# Patient Record
Sex: Female | Born: 1960
Health system: Southern US, Community
[De-identification: ages and names within clinical notes are randomized; demographics above are authoritative.]

## PROBLEM LIST (undated history)

## (undated) DIAGNOSIS — R011 Cardiac murmur, unspecified: Secondary | ICD-10-CM

## (undated) DIAGNOSIS — D649 Anemia, unspecified: Secondary | ICD-10-CM

## (undated) DIAGNOSIS — T8859XA Other complications of anesthesia, initial encounter: Secondary | ICD-10-CM

## (undated) DIAGNOSIS — T4145XA Adverse effect of unspecified anesthetic, initial encounter: Secondary | ICD-10-CM

## (undated) DIAGNOSIS — T7840XA Allergy, unspecified, initial encounter: Secondary | ICD-10-CM

## (undated) DIAGNOSIS — M1990 Unspecified osteoarthritis, unspecified site: Secondary | ICD-10-CM

## (undated) HISTORY — DX: Other specified conditions originating in the perinatal period: R01.1

## (undated) HISTORY — PX: OTHER SURGICAL HISTORY: SHX169

## (undated) HISTORY — DX: Allergy, unspecified, initial encounter: T78.40XA

## (undated) HISTORY — PX: COLONOSCOPY: SHX174

## (undated) HISTORY — DX: Anemia, unspecified: D64.9

---

## 2005-05-15 ENCOUNTER — Ambulatory Visit: Payer: Self-pay | Admitting: Podiatry

## 2005-08-21 ENCOUNTER — Ambulatory Visit: Payer: Self-pay | Admitting: Unknown Physician Specialty

## 2006-03-13 HISTORY — PX: REFRACTIVE SURGERY: SHX103

## 2006-07-04 ENCOUNTER — Ambulatory Visit: Payer: Self-pay

## 2009-10-11 ENCOUNTER — Ambulatory Visit: Payer: Self-pay

## 2010-03-31 ENCOUNTER — Ambulatory Visit: Payer: Self-pay | Admitting: Podiatry

## 2010-04-13 ENCOUNTER — Ambulatory Visit: Payer: Self-pay | Admitting: Podiatry

## 2010-10-17 ENCOUNTER — Other Ambulatory Visit: Payer: Self-pay | Admitting: Emergency Medicine

## 2010-10-19 ENCOUNTER — Ambulatory Visit: Payer: Self-pay | Admitting: Emergency Medicine

## 2013-03-31 ENCOUNTER — Ambulatory Visit: Payer: Self-pay

## 2013-03-31 LAB — COMPREHENSIVE METABOLIC PANEL
ALK PHOS: 73 U/L
ALT: 26 U/L (ref 12–78)
AST: 14 U/L — AB (ref 15–37)
Albumin: 3.7 g/dL (ref 3.4–5.0)
Anion Gap: 10 (ref 7–16)
BUN: 15 mg/dL (ref 7–18)
Bilirubin,Total: 0.4 mg/dL (ref 0.2–1.0)
CALCIUM: 9.5 mg/dL (ref 8.5–10.1)
CO2: 28 mmol/L (ref 21–32)
CREATININE: 0.76 mg/dL (ref 0.60–1.30)
Chloride: 103 mmol/L (ref 98–107)
EGFR (African American): >60
Glucose: 104 mg/dL — ABNORMAL HIGH (ref 65–99)
Osmolality: 282 (ref 275–301)
Potassium: 4.4 mmol/L (ref 3.5–5.1)
Sodium: 141 mmol/L (ref 136–145)
TOTAL PROTEIN: 7.4 g/dL (ref 6.4–8.2)

## 2013-03-31 LAB — CBC WITH DIFFERENTIAL/PLATELET
BASOS ABS: 0.1 10*3/uL (ref 0.0–0.1)
BASOS PCT: 1.2 %
EOS ABS: 0.3 10*3/uL (ref 0.0–0.7)
Eosinophil %: 3.2 %
HCT: 38 % (ref 35.0–47.0)
HGB: 12.8 g/dL (ref 12.0–16.0)
LYMPHS ABS: 1.7 10*3/uL (ref 1.0–3.6)
LYMPHS PCT: 20.4 %
MCH: 30.4 pg (ref 26.0–34.0)
MCHC: 33.8 g/dL (ref 32.0–36.0)
MCV: 90 fL (ref 80–100)
MONOS PCT: 8.4 %
Monocyte #: 0.7 x10 3/mm (ref 0.2–0.9)
NEUTROS ABS: 5.7 10*3/uL (ref 1.4–6.5)
NEUTROS PCT: 66.8 %
Platelet: 294 10*3/uL (ref 150–440)
RBC: 4.22 10*6/uL (ref 3.80–5.20)
RDW: 12.5 % (ref 11.5–14.5)
WBC: 8.5 10*3/uL (ref 3.6–11.0)

## 2013-04-07 ENCOUNTER — Encounter: Payer: Self-pay | Admitting: Internal Medicine

## 2013-05-20 ENCOUNTER — Ambulatory Visit (AMBULATORY_SURGERY_CENTER): Payer: 59

## 2013-05-20 VITALS — Ht 65.0 in | Wt 176.6 lb

## 2013-05-20 DIAGNOSIS — Z1211 Encounter for screening for malignant neoplasm of colon: Secondary | ICD-10-CM

## 2013-05-20 MED ORDER — MOVIPREP 100 G PO SOLR: ORAL | Status: DC

## 2013-06-03 ENCOUNTER — Encounter: Payer: Self-pay | Admitting: Internal Medicine

## 2013-06-03 ENCOUNTER — Ambulatory Visit (AMBULATORY_SURGERY_CENTER): Payer: 59 | Admitting: Internal Medicine

## 2013-06-03 VITALS — BP 137/94 | HR 48 | Temp 98.0°F | Resp 24 | Ht 65.0 in | Wt 176.0 lb

## 2013-06-03 DIAGNOSIS — D126 Benign neoplasm of colon, unspecified: Secondary | ICD-10-CM

## 2013-06-03 DIAGNOSIS — Z1211 Encounter for screening for malignant neoplasm of colon: Secondary | ICD-10-CM

## 2013-06-03 MED ORDER — SODIUM CHLORIDE 0.9 % IV SOLN: 500.0000 mL | INTRAVENOUS | Status: DC

## 2013-06-03 NOTE — Progress Notes (Signed)
Called to room to assist during endoscopic procedure.  Patient ID and intended procedure confirmed with present staff. Received instructions for my participation in the procedure from the performing physician.  

## 2013-06-03 NOTE — Op Note (Signed)
[facility]  [address] [place], [id number]   COLONOSCOPY PROCEDURE REPORT  PATIENT: Sara Figueroa, Sara Figueroa  MR#: [id number] BIRTHDATE: 1960-05-28 , 63  yrs. old GENDER: Female ENDOSCOPIST: Jerene Bears, MD REFERRED BY: Maurine Minister PROCEDURE DATE:  06/03/2013 PROCEDURE:   Colonoscopy with snare polypectomy and Colonoscopy with cold biopsy polypectomy First Screening Colonoscopy - Avg.  risk and is 50 yrs.  old or older Yes.  Prior Negative Screening - Now for repeat screening. N/A  History of Adenoma - Now for follow-up colonoscopy & has been > or = to 3 yrs.  N/A  Polyps Removed Today? Yes. ASA CLASS:   Class II INDICATIONS:average risk screening and first colonoscopy. MEDICATIONS: MAC sedation, administered by CRNA, Propofol (Diprivan), and Propofol (Diprivan) 330 mg IV  DESCRIPTION OF PROCEDURE:   After the risks benefits and alternatives of the procedure were thoroughly explained, informed consent was obtained.  A digital rectal exam revealed no rectal mass.   The LB [id number]  endoscope was introduced through the anus and advanced to the terminal ileum which was intubated for a short distance. No adverse events experienced.   The quality of the prep was good, using MoviPrep  The instrument was then slowly withdrawn as the colon was fully examined.   COLON FINDINGS: The mucosa appeared normal in the terminal ileum. Two sessile polyps measuring 3-4 mm in size were found at the hepatic flexure and in the descending colon.  Polypectomy was performed with cold forceps and using cold snare.  All resections were complete and all polyp tissue was completely retrieved.   The colon mucosa was otherwise normal.  Retroflexed views revealed no abnormalities. The time to cecum=3 minutes 33 seconds.  Withdrawal time=13 minutes 40 seconds.  The scope was withdrawn and the procedure completed.  COMPLICATIONS: There were no complications.     ENDOSCOPIC  IMPRESSION: 1.   Normal mucosa in the terminal ileum 2.   Two sessile polyps measuring 3-4 mm in size were found at the hepatic flexure and in the descending colon; Polypectomy was performed with cold forceps and using cold snare 3.   The colon mucosa was otherwise normal  RECOMMENDATIONS: 1.  Await pathology results 2.  If the polyps removed today are proven to be adenomatous (pre-cancerous) polyps, you will need a repeat colonoscopy in 5 years.  Otherwise you should continue to follow colorectal cancer screening guidelines for "routine risk" patients with colonoscopy in 10 years.  You will receive a letter within 1-2 weeks with the results of your biopsy as well as final recommendations.  Please call my office if you have not received a letter after 3 weeks.   eSigned:  Jerene Bears, MD 06/03/2013 10:47 AM   cc: The Patient; Maurine Minister   PATIENT NAME:  Milton, Streicher MR#: [id number]

## 2013-06-03 NOTE — Progress Notes (Signed)
Report to pacu rn, vss, bbs=clear 

## 2013-06-03 NOTE — Patient Instructions (Signed)
YOU HAD AN ENDOSCOPIC PROCEDURE TODAY AT THE [facility]: Refer to the procedure report that was given to you for any specific questions about what was found during the examination.  If the procedure report does not answer your questions, please call your gastroenterologist to clarify.  If you requested that your care partner not be given the details of your procedure findings, then the procedure report has been included in a sealed envelope for you to review at your convenience later.  YOU SHOULD EXPECT: Some feelings of bloating in the abdomen. Passage of more gas than usual.  Walking can help get rid of the air that was put into your GI tract during the procedure and reduce the bloating. If you had a lower endoscopy (such as a colonoscopy or flexible sigmoidoscopy) you may notice spotting of blood in your stool or on the toilet paper. If you underwent a bowel prep for your procedure, then you may not have a normal bowel movement for a few days.  DIET: Your first meal following the procedure should be a light meal and then it is ok to progress to your normal diet.  A half-sandwich or bowl of soup is an example of a good first meal.  Heavy or fried foods are harder to digest and may make you feel nauseous or bloated.  Likewise meals heavy in dairy and vegetables can cause extra gas to form and this can also increase the bloating.  Drink plenty of fluids but you should avoid alcoholic beverages for 24 hours.  ACTIVITY: Your care partner should take you home directly after the procedure.  You should plan to take it easy, moving slowly for the rest of the day.  You can resume normal activity the day after the procedure however you should NOT DRIVE or use heavy machinery for 24 hours (because of the sedation medicines used during the test).    SYMPTOMS TO REPORT IMMEDIATELY: A gastroenterologist can be reached at any hour.  During normal business hours, 8:30 AM to 5:00 PM Monday through Friday,  call [phone].  After hours and on weekends, please call the GI answering service at [phone] who will take a message and have the physician on call contact you.   Following lower endoscopy (colonoscopy or flexible sigmoidoscopy):  Excessive amounts of blood in the stool  Significant tenderness or worsening of abdominal pains  Swelling of the abdomen that is new, acute  Fever of 100F or higher  FOLLOW UP: If any biopsies were taken you will be contacted by phone or by letter within the next 1-3 weeks.  Call your gastroenterologist if you have not heard about the biopsies in 3 weeks.  Our staff will call the home number listed on your records the next business day following your procedure to check on you and address any questions or concerns that you may have at that time regarding the information given to you following your procedure. This is a courtesy call and so if there is no answer at the home number and we have not heard from you through the emergency physician on call, we will assume that you have returned to your regular daily activities without incident.  SIGNATURES/CONFIDENTIALITY: You and/or your care partner have signed paperwork which will be entered into your electronic medical record.  These signatures attest to the fact that that the information above on your After Visit Summary has been reviewed and is understood.  Full responsibility of the confidentiality of this   discharge information lies with you and/or your care-partner.  Recommendations Await pathology results Next colonoscopy will be determined by pathology report

## 2013-06-04 ENCOUNTER — Telehealth: Payer: Self-pay | Admitting: *Deleted

## 2013-06-04 NOTE — Telephone Encounter (Signed)
  Follow up Call-  Call back number 06/03/2013  Post procedure Call Back phone  # [phone]  Permission to leave phone message Yes     No answer,left message.

## 2013-06-09 ENCOUNTER — Encounter: Payer: Self-pay | Admitting: Internal Medicine

## 2013-06-10 ENCOUNTER — Encounter: Payer: Self-pay | Admitting: *Deleted

## 2013-07-03 ENCOUNTER — Ambulatory Visit: Payer: Self-pay | Admitting: Family Medicine

## 2013-08-05 ENCOUNTER — Ambulatory Visit: Payer: Self-pay | Admitting: Family Medicine

## 2014-06-23 ENCOUNTER — Encounter: Payer: Self-pay | Admitting: *Deleted

## 2014-09-11 DIAGNOSIS — G4700 Insomnia, unspecified: Secondary | ICD-10-CM | POA: Insufficient documentation

## 2014-09-11 DIAGNOSIS — B001 Herpesviral vesicular dermatitis: Secondary | ICD-10-CM | POA: Insufficient documentation

## 2014-09-11 DIAGNOSIS — Z780 Asymptomatic menopausal state: Secondary | ICD-10-CM | POA: Insufficient documentation

## 2014-09-11 DIAGNOSIS — D649 Anemia, unspecified: Secondary | ICD-10-CM | POA: Insufficient documentation

## 2014-09-11 DIAGNOSIS — R001 Bradycardia, unspecified: Secondary | ICD-10-CM | POA: Insufficient documentation

## 2014-09-11 DIAGNOSIS — IMO0002 Reserved for concepts with insufficient information to code with codable children: Secondary | ICD-10-CM | POA: Insufficient documentation

## 2014-09-11 DIAGNOSIS — S32009A Unspecified fracture of unspecified lumbar vertebra, initial encounter for closed fracture: Secondary | ICD-10-CM | POA: Insufficient documentation

## 2014-09-11 DIAGNOSIS — Z87442 Personal history of urinary calculi: Secondary | ICD-10-CM | POA: Insufficient documentation

## 2014-10-01 ENCOUNTER — Ambulatory Visit (INDEPENDENT_AMBULATORY_CARE_PROVIDER_SITE_OTHER): Payer: 59 | Admitting: Physician Assistant

## 2014-10-01 ENCOUNTER — Ambulatory Visit
Admission: RE | Admit: 2014-10-01 | Discharge: 2014-10-01 | Disposition: A | Discharge status: Home / Self Care | Payer: 59 | Source: Ambulatory Visit | Attending: Physician Assistant | Admitting: Physician Assistant

## 2014-10-01 ENCOUNTER — Encounter: Payer: Self-pay | Admitting: Physician Assistant

## 2014-10-01 VITALS — BP 150/96 | HR 72 | Temp 98.1°F | Resp 16 | Ht 65.0 in | Wt 185.8 lb

## 2014-10-01 DIAGNOSIS — R946 Abnormal results of thyroid function studies: Secondary | ICD-10-CM

## 2014-10-01 DIAGNOSIS — R7309 Other abnormal glucose: Secondary | ICD-10-CM | POA: Insufficient documentation

## 2014-10-01 DIAGNOSIS — N8110 Cystocele, unspecified: Secondary | ICD-10-CM | POA: Insufficient documentation

## 2014-10-01 DIAGNOSIS — M25475 Effusion, left foot: Secondary | ICD-10-CM

## 2014-10-01 DIAGNOSIS — G4726 Circadian rhythm sleep disorder, shift work type: Secondary | ICD-10-CM | POA: Insufficient documentation

## 2014-10-01 DIAGNOSIS — M25551 Pain in right hip: Secondary | ICD-10-CM | POA: Diagnosis not present

## 2014-10-01 DIAGNOSIS — Z1239 Encounter for other screening for malignant neoplasm of breast: Secondary | ICD-10-CM

## 2014-10-01 DIAGNOSIS — Z0000 Encounter for general adult medical examination without abnormal findings: Secondary | ICD-10-CM

## 2014-10-01 DIAGNOSIS — M5126 Other intervertebral disc displacement, lumbar region: Secondary | ICD-10-CM | POA: Diagnosis not present

## 2014-10-01 DIAGNOSIS — R350 Frequency of micturition: Secondary | ICD-10-CM | POA: Diagnosis not present

## 2014-10-01 DIAGNOSIS — Z1211 Encounter for screening for malignant neoplasm of colon: Secondary | ICD-10-CM | POA: Diagnosis not present

## 2014-10-01 DIAGNOSIS — B001 Herpesviral vesicular dermatitis: Secondary | ICD-10-CM

## 2014-10-01 DIAGNOSIS — Z124 Encounter for screening for malignant neoplasm of cervix: Secondary | ICD-10-CM | POA: Diagnosis not present

## 2014-10-01 DIAGNOSIS — N3000 Acute cystitis without hematuria: Secondary | ICD-10-CM

## 2014-10-01 DIAGNOSIS — R7989 Other specified abnormal findings of blood chemistry: Secondary | ICD-10-CM | POA: Insufficient documentation

## 2014-10-01 DIAGNOSIS — IMO0002 Reserved for concepts with insufficient information to code with codable children: Secondary | ICD-10-CM

## 2014-10-01 LAB — POCT URINALYSIS DIPSTICK
Bilirubin, UA: NEGATIVE
Blood, UA: NEGATIVE
Glucose, UA: NEGATIVE
Ketones, UA: NEGATIVE
NITRITE UA: NEGATIVE
PH UA: 5
PROTEIN UA: NEGATIVE
Spec Grav, UA: 1.02
Urobilinogen, UA: 0.2

## 2014-10-01 LAB — POC HEMOCCULT BLD/STL (OFFICE/1-CARD/DIAGNOSTIC): FECAL OCCULT BLD: NEGATIVE

## 2014-10-01 MED ORDER — DOXEPIN HCL 3 MG PO TABS: 1.0000 | ORAL_TABLET | Freq: Every evening | ORAL | Status: DC | PRN

## 2014-10-01 MED ORDER — CIPROFLOXACIN HCL 500 MG PO TABS: 500.0000 mg | ORAL_TABLET | Freq: Two times a day (BID) | ORAL | Status: DC

## 2014-10-01 MED ORDER — VALACYCLOVIR HCL 1 G PO TABS: 1000.0000 mg | ORAL_TABLET | Freq: Two times a day (BID) | ORAL | Status: DC

## 2014-10-01 NOTE — Progress Notes (Deleted)
Patient ID: Sara Figueroa, female   DOB: 06-02-1960, 54 y.o.   MRN: [id number]       Patient: Sara Figueroa, Female    DOB: 11/06/1960, 54 y.o.   MRN: [id number] Visit Date: 10/01/2014  Today's Provider: Mar Daring, PA-C   Chief Complaint  Patient presents with  . Annual Exam   Subjective:    Annual physical exam Sara Figueroa is a 54 y.o. female who presents today for health maintenance and complete physical. She feels {DESC; WELL/FAIRLY WELL/POORLY:18703}. She reports exercising ***. She reports she is sleeping {DESC; WELL/FAIRLY WELL/POORLY:18703}.  -----------------------------------------------------------------   Review of Systems  Social History She  reports that she has quit smoking. Her smoking use included Cigarettes. She has a 10 pack-year smoking history. She has never used smokeless tobacco. She reports that she drinks alcohol. She reports that she does not use illicit drugs.  Patient Active Problem List   Diagnosis Date Noted  . Absolute anemia 09/11/2014  . Bradycardia 09/11/2014  . Compression fracture 09/11/2014  . Fracture of lumbar spine 09/11/2014  . Cold sore 09/11/2014  . Cannot sleep 09/11/2014  . Personal history of urinary calculi 09/11/2014  . Asymptomatic postmenopausal status 09/11/2014    Past Surgical History  Procedure Laterality Date  . Refractive surgery  2008    Bil  . Cyst on eyebrow      right    Family History Her family history includes Atrial fibrillation in her mother; Cancer in her maternal grandfather and maternal grandmother; Congestive Heart Failure in her paternal grandmother; Emphysema in her father; Fibroids in her sister; Heart disease in her mother; Hypertension in her mother; Parkinson's disease in her father; Skin cancer in her father; Stroke in her father; Ulcerative colitis in her mother.    No Known Allergies  Previous Medications   ACYCLOVIR (ZOVIRAX) 200 MG CAPSULE    Take 200 mg by mouth as  needed.   CYCLOBENZAPRINE (FLEXERIL) 10 MG TABLET    Take 0.5-1 tablets by mouth at bedtime.   MULTIPLE VITAMIN (MULTIVITAMIN) TABLET    Take 1 tablet by mouth daily.   VALACYCLOVIR (VALTREX) 1000 MG TABLET    Take 1 tablet by mouth 2 (two) times daily.    Patient Care Team: Mar Daring, PA-C as PCP - General (Physician Assistant)     Objective:   Vitals: There were no vitals taken for this visit.   Physical Exam   Depression Screen No flowsheet data found.    Assessment & Plan:     Routine Health Maintenance and Physical Exam  Exercise Activities and Dietary recommendations Goals    None      Immunization History  Administered Date(s) Administered  . Tdap 07/03/2005    Health Maintenance  Topic Date Due  . HIV Screening  07/15/1973  . PAP SMEAR  07/15/1976  . MAMMOGRAM  07/15/2008  . INFLUENZA VACCINE  10/12/2014  . TETANUS/TDAP  07/04/2015  . COLONOSCOPY  06/04/2018      Discussed health benefits of physical activity, and encouraged her to engage in regular exercise appropriate for her age and condition.    --------------------------------------------------------------------

## 2014-10-01 NOTE — Patient Instructions (Signed)
Health Maintenance Adopting a healthy lifestyle and getting preventive care can go a long way to promote health and wellness. Talk with your health care provider about what schedule of regular examinations is right for you. This is a good chance for you to check in with your provider about disease prevention and staying healthy. In between checkups, there are plenty of things you can do on your own. Experts have done a lot of research about which lifestyle changes and preventive measures are most likely to keep you healthy. Ask your health care provider for more information. WEIGHT AND DIET  Eat a healthy diet 1. Be sure to include plenty of vegetables, fruits, low-fat dairy products, and lean protein. 2. Do not eat a lot of foods high in solid fats, added sugars, or salt. 3. Get regular exercise. This is one of the most important things you can do for your health. 1. Most adults should exercise for at least 150 minutes each week. The exercise should increase your heart rate and make you sweat (moderate-intensity exercise). 2. Most adults should also do strengthening exercises at least twice a week. This is in addition to the moderate-intensity exercise.  Maintain a healthy weight 1. Body mass index (BMI) is a measurement that can be used to identify possible weight problems. It estimates body fat based on height and weight. Your health care provider can help determine your BMI and help you achieve or maintain a healthy weight. 2. For females 6 years of age and older:  1. A BMI below 18.5 is considered underweight. 2. A BMI of 18.5 to 24.9 is normal. 3. A BMI of 25 to 29.9 is considered overweight. 4. A BMI of 30 and above is considered obese.  Watch levels of cholesterol and blood lipids 1. You should start having your blood tested for lipids and cholesterol at 56 years of age, then have this test every 5 years. 2. You may need to have your cholesterol levels checked more often if: 1. Your  lipid or cholesterol levels are high. 2. You are older than 56 years of age. 3. You are at high risk for heart disease.  CANCER SCREENING   Lung Cancer 1. Lung cancer screening is recommended for adults 7-87 years old who are at high risk for lung cancer because of a history of smoking. 2. A yearly low-dose CT scan of the lungs is recommended for people who: 1. Currently smoke. 2. Have quit within the past 15 years. 3. Have at least a 30-pack-year history of smoking. A pack year is smoking an average of one pack of cigarettes a day for 1 year. 3. Yearly screening should continue until it has been 15 years since you quit. 4. Yearly screening should stop if you develop a health problem that would prevent you from having lung cancer treatment.  Breast Cancer  Practice breast self-awareness. This means understanding how your breasts normally appear and feel.  It also means doing regular breast self-exams. Let your health care provider know about any changes, no matter how small.  If you are in your 20s or 30s, you should have a clinical breast exam (CBE) by a health care provider every 1-3 years as part of a regular health exam.  If you are 30 or older, have a CBE every year. Also consider having a breast X-Madlock (mammogram) every year.  If you have a family history of breast cancer, talk to your health care provider about genetic screening.  If you are  at high risk for breast cancer, talk to your health care provider about having an MRI and a mammogram every year.  Breast cancer gene (BRCA) assessment is recommended for women who have family members with BRCA-related cancers. BRCA-related cancers include:  Breast.  Ovarian.  Tubal.  Peritoneal cancers.  Results of the assessment will determine the need for genetic counseling and BRCA1 and BRCA2 testing. Cervical Cancer Routine pelvic examinations to screen for cervical cancer are no longer recommended for nonpregnant women who  are considered low risk for cancer of the pelvic organs (ovaries, uterus, and vagina) and who do not have symptoms. A pelvic examination may be necessary if you have symptoms including those associated with pelvic infections. Ask your health care provider if a screening pelvic exam is right for you.   The Pap test is the screening test for cervical cancer for women who are considered at risk.  If you had a hysterectomy for a problem that was not cancer or a condition that could lead to cancer, then you no longer need Pap tests.  If you are older than 65 years, and you have had normal Pap tests for the past 10 years, you no longer need to have Pap tests.  If you have had past treatment for cervical cancer or a condition that could lead to cancer, you need Pap tests and screening for cancer for at least 20 years after your treatment.  If you no longer get a Pap test, assess your risk factors if they change (such as having a new sexual partner). This can affect whether you should start being screened again.  Some women have medical problems that increase their chance of getting cervical cancer. If this is the case for you, your health care provider may recommend more frequent screening and Pap tests.  The human papillomavirus (HPV) test is another test that may be used for cervical cancer screening. The HPV test looks for the virus that can cause cell changes in the cervix. The cells collected during the Pap test can be tested for HPV.  The HPV test can be used to screen women 33 years of age and older. Getting tested for HPV can extend the interval between normal Pap tests from three to five years.  An HPV test also should be used to screen women of any age who have unclear Pap test results.  After 56 years of age, women should have HPV testing as often as Pap tests.  Colorectal Cancer  This type of cancer can be detected and often prevented.  Routine colorectal cancer screening usually  begins at 56 years of age and continues through 56 years of age.  Your health care provider may recommend screening at an earlier age if you have risk factors for colon cancer.  Your health care provider may also recommend using home test kits to check for hidden blood in the stool.  A small camera at the end of a tube can be used to examine your colon directly (sigmoidoscopy or colonoscopy). This is done to check for the earliest forms of colorectal cancer.  Routine screening usually begins at age 41.  Direct examination of the colon should be repeated every 5-10 years through 56 years of age. However, you may need to be screened more often if early forms of precancerous polyps or small growths are found. Skin Cancer  Check your skin from head to toe regularly.  Tell your health care provider about any new moles or changes in  moles, especially if there is a change in a mole's shape or color.  Also tell your health care provider if you have a mole that is larger than the size of a pencil eraser.  Always use sunscreen. Apply sunscreen liberally and repeatedly throughout the day.  Protect yourself by wearing long sleeves, pants, a wide-brimmed hat, and sunglasses whenever you are outside. HEART DISEASE, DIABETES, AND HIGH BLOOD PRESSURE   Have your blood pressure checked at least every 1-2 years. High blood pressure causes heart disease and increases the risk of stroke.  If you are between 32 years and 30 years old, ask your health care provider if you should take aspirin to prevent strokes.  Have regular diabetes screenings. This involves taking a blood sample to check your fasting blood sugar level.  If you are at a normal weight and have a low risk for diabetes, have this test once every three years after 56 years of age.  If you are overweight and have a high risk for diabetes, consider being tested at a younger age or more often. PREVENTING INFECTION  Hepatitis B  If you have a  higher risk for hepatitis B, you should be screened for this virus. You are considered at high risk for hepatitis B if:  You were born in a country where hepatitis B is common. Ask your health care provider which countries are considered high risk.  Your parents were born in a high-risk country, and you have not been immunized against hepatitis B (hepatitis B vaccine).  You have HIV or AIDS.  You use needles to inject street drugs.  You live with someone who has hepatitis B.  You have had sex with someone who has hepatitis B.  You get hemodialysis treatment.  You take certain medicines for conditions, including cancer, organ transplantation, and autoimmune conditions. Hepatitis C  Blood testing is recommended for:  Everyone born from [birth year] through 1967.  Anyone with known risk factors for hepatitis C. Sexually transmitted infections (STIs)  You should be screened for sexually transmitted infections (STIs) including gonorrhea and chlamydia if:  You are sexually active and are younger than 56 years of age.  You are older than 56 years of age and your health care provider tells you that you are at risk for this type of infection.  Your sexual activity has changed since you were last screened and you are at an increased risk for chlamydia or gonorrhea. Ask your health care provider if you are at risk.  If you do not have HIV, but are at risk, it may be recommended that you take a prescription medicine daily to prevent HIV infection. This is called pre-exposure prophylaxis (PrEP). You are considered at risk if:  You are sexually active and do not regularly use condoms or know the HIV status of your partner(s).  You take drugs by injection.  You are sexually active with a partner who has HIV. Talk with your health care provider about whether you are at high risk of being infected with HIV. If you choose to begin PrEP, you should first be tested for HIV. You should then be tested  every 3 months for as long as you are taking PrEP.  PREGNANCY   If you are premenopausal and you may become pregnant, ask your health care provider about preconception counseling.  If you may become pregnant, take 400 to 800 micrograms (mcg) of folic acid every day.  If you want to prevent pregnancy, talk to your  health care provider about birth control (contraception). OSTEOPOROSIS AND MENOPAUSE   Osteoporosis is a disease in which the bones lose minerals and strength with aging. This can result in serious bone fractures. Your risk for osteoporosis can be identified using a bone density scan.  If you are 34 years of age or older, or if you are at risk for osteoporosis and fractures, ask your health care provider if you should be screened.  Ask your health care provider whether you should take a calcium or vitamin D supplement to lower your risk for osteoporosis.  Menopause may have certain physical symptoms and risks.  Hormone replacement therapy may reduce some of these symptoms and risks. Talk to your health care provider about whether hormone replacement therapy is right for you.  HOME CARE INSTRUCTIONS   Schedule regular health, dental, and eye exams.  Stay current with your immunizations.   Do not use any tobacco products including cigarettes, chewing tobacco, or electronic cigarettes.  If you are pregnant, do not drink alcohol.  If you are breastfeeding, limit how much and how often you drink alcohol.  Limit alcohol intake to no more than 1 drink per day for nonpregnant women. One drink equals 12 ounces of beer, 5 ounces of wine, or 1 ounces of hard liquor.  Do not use street drugs.  Do not share needles.  Ask your health care provider for help if you need support or information about quitting drugs.  Tell your health care provider if you often feel depressed.  Tell your health care provider if you have ever been abused or do not feel safe at home. Document  Released: 09/12/2010 Document Revised: 07/14/2013 Document Reviewed: 01/29/2013 [facility] Patient Information 2015 [place], [place]. This information is not intended to replace advice given to you by your health care provider. Make sure you discuss any questions you have with your health care provider.     Why follow it? Research shows. . Those who follow the Mediterranean diet have a reduced risk of heart disease  . The diet is associated with a reduced incidence of Parkinson's and Alzheimer's diseases . People following the diet may have longer life expectancies and lower rates of chronic diseases  . The Dietary Guidelines for Americans recommends the Mediterranean diet as an eating plan to promote health and prevent disease  What Is the Mediterranean Diet?  . Healthy eating plan based on typical foods and recipes of Mediterranean-style cooking . The diet is primarily a plant based diet; these foods should make up a majority of meals   Starches - Plant based foods should make up a majority of meals - They are an important sources of vitamins, minerals, energy, antioxidants, and fiber - Choose whole grains, foods high in fiber and minimally processed items  - Typical grain sources include wheat, oats, barley, corn, brown rice, bulgar, farro, millet, polenta, couscous  - Various types of beans include chickpeas, lentils, fava beans, black beans, white beans   Fruits  Veggies - Large quantities of antioxidant rich fruits & veggies; 6 or more servings  - Vegetables can be eaten raw or lightly drizzled with oil and cooked  - Vegetables common to the traditional Mediterranean Diet include: artichokes, arugula, beets, broccoli, brussel sprouts, cabbage, carrots, celery, collard greens, cucumbers, eggplant, kale, leeks, lemons, lettuce, mushrooms, okra, onions, peas, peppers, potatoes, pumpkin, radishes, rutabaga, shallots, spinach, sweet potatoes, turnips, zucchini - Fruits common to the Mediterranean  Diet include: apples, apricots, avocados, cherries, clementines, dates, figs, grapefruits,  grapes, melons, nectarines, oranges, peaches, pears, pomegranates, strawberries, tangerines  Fats - Replace butter and margarine with healthy oils, such as olive oil, canola oil, and tahini  - Limit nuts to no more than a handful a day  - Nuts include walnuts, almonds, pecans, pistachios, pine nuts  - Limit or avoid candied, honey roasted or heavily salted nuts - Olives are central to the Mediterranean diet - can be eaten whole or used in a variety of dishes   Meats Protein - Limiting red meat: no more than a few times a month - When eating red meat: choose lean cuts and keep the portion to the size of deck of cards - Eggs: approx. 0 to 4 times a week  - Fish and lean poultry: at least 2 a week  - Healthy protein sources include, chicken, [place], lean beef, lamb - Increase intake of seafood such as tuna, salmon, trout, mackerel, shrimp, scallops - Avoid or limit high fat processed meats such as sausage and bacon  Dairy - Include moderate amounts of low fat dairy products  - Focus on healthy dairy such as fat free yogurt, skim milk, low or reduced fat cheese - Limit dairy products higher in fat such as whole or 2% milk, cheese, ice cream  Alcohol - Moderate amounts of red wine is ok  - No more than 5 oz daily for women (all ages) and men older than age 74  - No more than 10 oz of wine daily for men younger than 44  Other - Limit sweets and other desserts  - Use herbs and spices instead of salt to flavor foods  - Herbs and spices common to the traditional Mediterranean Diet include: basil, bay leaves, chives, cloves, cumin, fennel, garlic, lavender, marjoram, mint, oregano, parsley, pepper, rosemary, sage, savory, sumac, tarragon, thyme   It's not just a diet, it's a lifestyle:  . The Mediterranean diet includes lifestyle factors typical of those in the region  . Foods, drinks and meals are best eaten  with others and savored . Daily physical activity is important for overall good health . This could be strenuous exercise like running and aerobics . This could also be more leisurely activities such as walking, housework, yard-work, or taking the stairs . Moderation is the key; a balanced and healthy diet accommodates most foods and drinks . Consider portion sizes and frequency of consumption of certain foods   Meal Ideas & Options:  . Breakfast:  o Whole wheat toast or whole wheat English muffins with peanut butter & hard boiled egg o Steel cut oats topped with apples & cinnamon and skim milk  o Fresh fruit: banana, strawberries, melon, berries, peaches  o Smoothies: strawberries, bananas, greek yogurt, peanut butter o Low fat greek yogurt with blueberries and granola  o Egg white omelet with spinach and mushrooms o Breakfast couscous: whole wheat couscous, apricots, skim milk, cranberries  . Sandwiches:  o Hummus and grilled vegetables (peppers, zucchini, squash) on whole wheat bread   o Grilled chicken on whole wheat pita with lettuce, tomatoes, cucumbers or tzatziki  o Tuna salad on whole wheat bread: tuna salad made with greek yogurt, olives, red peppers, capers, green onions o Garlic rosemary lamb pita: lamb sauted with garlic, rosemary, salt & pepper; add lettuce, cucumber, greek yogurt to pita - flavor with lemon juice and black pepper  . Seafood:  o Mediterranean grilled salmon, seasoned with garlic, basil, parsley, lemon juice and black pepper o Shrimp, lemon, and spinach  whole-grain pasta salad made with low fat greek yogurt  o Seared scallops with lemon orzo  o Seared tuna steaks seasoned salt, pepper, coriander topped with tomato mixture of olives, tomatoes, olive oil, minced garlic, parsley, green onions and cappers  . Meats:  o Herbed greek chicken salad with kalamata olives, cucumber, feta  o Red bell peppers stuffed with spinach, bulgur, lean ground beef (or lentils) &  topped with feta   o Kebabs: skewers of chicken, tomatoes, onions, zucchini, squash  o [place] burgers: made with red onions, mint, dill, lemon juice, feta cheese topped with roasted red peppers . Vegetarian o Cucumber salad: cucumbers, artichoke hearts, celery, red onion, feta cheese, tossed in olive oil & lemon juice  o Hummus and whole grain pita points with a greek salad (lettuce, tomato, feta, olives, cucumbers, red onion) o Lentil soup with celery, carrots made with vegetable broth, garlic, salt and pepper  o Tabouli salad: parsley, bulgur, mint, scallions, cucumbers, tomato, radishes, lemon juice, olive oil, salt and pepper.      American Heart Association (AHA) Exercise Recommendation  Being physically active is important to prevent heart disease and stroke, the nation's No. 1and No. 5killers. To improve overall cardiovascular health, we suggest at least 150 minutes per week of moderate exercise or 75 minutes per week of vigorous exercise (or a combination of moderate and vigorous activity). Thirty minutes a day, five times a week is an easy goal to remember. You will also experience benefits even if you divide your time into two or three segments of 10 to 15 minutes per day.  For people who would benefit from lowering their blood pressure or cholesterol, we recommend 40 minutes of aerobic exercise of moderate to vigorous intensity three to four times a week to lower the risk for heart attack and stroke.  Physical activity is anything that makes you move your body and burn calories.  This includes things like climbing stairs or playing sports. Aerobic exercises benefit your heart, and include walking, jogging, swimming or biking. Strength and stretching exercises are best for overall stamina and flexibility.  The simplest, positive change you can make to effectively improve your heart health is to start walking. It's enjoyable, free, easy, social and great exercise. A walking program is  flexible and boasts high success rates because people can stick with it. It's easy for walking to become a regular and satisfying part of life.   For Overall Cardiovascular Health:  At least 30 minutes of moderate-intensity aerobic activity at least 5 days per week for a total of 150  OR   At least 25 minutes of vigorous aerobic activity at least 3 days per week for a total of 75 minutes; or a combination of moderate- and vigorous-intensity aerobic activity  AND   Moderate- to high-intensity muscle-strengthening activity at least 2 days per week for additional health benefits.  For Lowering Blood Pressure and Cholesterol  An average 40 minutes of moderate- to vigorous-intensity aerobic activity 3 or 4 times per week  What if I can't make it to the time goal? Something is always better than nothing! And everyone has to start somewhere. Even if you've been sedentary for years, today is the day you can begin to make healthy changes in your life. If you don't think you'll make it for 30 or 40 minutes, set a reachable goal for today. You can work up toward your overall goal by increasing your time as you get stronger. Don't let all-or-nothing thinking  rob you of doing what you can every day.  Source:http://www.heart.org

## 2014-10-01 NOTE — Progress Notes (Signed)
Patient: Sara Figueroa, Female    DOB: 1960-08-03, 54 y.o.   MRN: [id number] Visit Date: 10/01/2014  Today's Provider: Mar Daring, PA-C   Chief Complaint  Patient presents with  . Annual Exam   Subjective:    Annual physical exam Sara Figueroa is a 54 y.o. female who presents today for health maintenance and complete physical. She feels fairly well. She reports exercising not enough. She reports she is sleeping poorly.  This is an ongoing problem and she has been taking doxepin 3 mg. This has been helping her sleep. She is postmenopausal and has had history of normal Pap smears. There is no history of cervical or ovarian cancer. She does do monthly breast exams.  There is no family history of breast cancer. She reports normal bowel movements. She denies melena and hematochezia. There is no family history of colon cancer.  -----------------------------------------------------------------   Review of Systems  Constitutional: Negative.   HENT: Positive for tinnitus. Ear discharge: sometimes.   Eyes: Negative.   Respiratory: Negative.   Cardiovascular: Negative.   Gastrointestinal: Negative.   Endocrine: Negative.   Genitourinary: Positive for frequency (going frequently to the bathroom).  Musculoskeletal: Positive for back pain, arthralgias and gait problem.  Skin: Negative.   Allergic/Immunologic: Positive for environmental allergies.  Neurological: Negative.   Hematological: Negative.   Psychiatric/Behavioral: Positive for sleep disturbance.    Social History She  reports that she has quit smoking. Her smoking use included Cigarettes. She has a 10 pack-year smoking history. She has never used smokeless tobacco. She reports that she drinks alcohol. She reports that she does not use illicit drugs.  Patient Active Problem List   Diagnosis Date Noted  . Absolute anemia 09/11/2014  . Bradycardia 09/11/2014  . Compression fracture 09/11/2014  . Fracture of  lumbar spine 09/11/2014  . Cold sore 09/11/2014  . Cannot sleep 09/11/2014  . Personal history of urinary calculi 09/11/2014  . Asymptomatic postmenopausal status 09/11/2014    Past Surgical History  Procedure Laterality Date  . Refractive surgery  2008    Bil  . Cyst on eyebrow      right    Family History Her family history includes Atrial fibrillation in her mother; Cancer in her maternal grandfather and maternal grandmother; Congestive Heart Failure in her paternal grandmother; Emphysema in her father; Fibroids in her sister; Heart disease in her mother; Hypertension in her mother; Parkinson's disease in her father; Skin cancer in her father; Stroke in her father; Ulcerative colitis in her mother.    No Known Allergies  Previous Medications   ACYCLOVIR (ZOVIRAX) 200 MG CAPSULE    Take 200 mg by mouth as needed.   CYCLOBENZAPRINE (FLEXERIL) 10 MG TABLET    Take 0.5-1 tablets by mouth at bedtime.   MILK THISTLE 1000 MG CAPS    Take by mouth daily.   MULTIPLE VITAMIN (MULTIVITAMIN) TABLET    Take 1 tablet by mouth daily.   PROBIOTIC PRODUCT (PROBIOTIC DAILY PO)    Take by mouth. Pinnacle probiotic-Take one daily   VALACYCLOVIR (VALTREX) 1000 MG TABLET    Take 1 tablet by mouth 2 (two) times daily.    Patient Care Team: Mar Daring, PA-C as PCP - General (Physician Assistant)     Objective:   Vitals: There were no vitals taken for this visit.   Physical Exam  Constitutional: She is oriented to person, place, and time. She appears well-developed and well-nourished. No distress.  HENT:  Head: Normocephalic and atraumatic.  Right Ear: Hearing, tympanic membrane, external ear and ear canal normal.  Left Ear: Hearing, tympanic membrane, external ear and ear canal normal.  Nose: Nose normal.  Mouth/Throat: Uvula is midline, oropharynx is clear and moist and mucous membranes are normal. No oropharyngeal exudate.  Eyes: Conjunctivae and EOM are normal. Pupils are equal,  round, and reactive to light. Right eye exhibits no discharge. Left eye exhibits no discharge. No scleral icterus.  Neck: Normal range of motion. Neck supple. No JVD present. Carotid bruit is not present. No tracheal deviation present. No thyromegaly present.  Cardiovascular: Normal rate, regular rhythm, normal heart sounds and intact distal pulses.  Exam reveals no gallop and no friction rub.   No murmur heard. Pulmonary/Chest: Effort normal and breath sounds normal. No respiratory distress. She has no wheezes. She has no rales. She exhibits no tenderness. Right breast exhibits no inverted nipple, no mass, no nipple discharge, no skin change and no tenderness. Left breast exhibits no inverted nipple, no mass, no nipple discharge, no skin change and no tenderness. Breasts are symmetrical.  Abdominal: Soft. Bowel sounds are normal. She exhibits no distension and no mass. There is no tenderness. There is no rebound and no guarding. Hernia confirmed negative in the right inguinal area and confirmed negative in the left inguinal area.  Genitourinary: Rectum normal, vagina normal and uterus normal.    No breast swelling, tenderness, discharge or bleeding. Pelvic exam was performed with patient supine. There is no rash, tenderness, lesion or injury on the right labia. There is no rash, tenderness, lesion or injury on the left labia. Cervix exhibits no motion tenderness, no discharge and no friability. Right adnexum displays no mass, no tenderness and no fullness. Left adnexum displays no mass, no tenderness and no fullness. No erythema, tenderness or bleeding in the vagina. No signs of injury around the vagina. No vaginal discharge found.  Musculoskeletal: Normal range of motion. She exhibits no edema or tenderness.  Lymphadenopathy:    She has no cervical adenopathy.       Right: No inguinal adenopathy present.       Left: No inguinal adenopathy present.  Neurological: She is alert and oriented to person,  place, and time. She has normal reflexes. No cranial nerve deficit. Coordination normal.  Skin: Skin is warm and dry. No rash noted. She is not diaphoretic.  Psychiatric: She has a normal mood and affect. Her behavior is normal. Judgment and thought content normal.  Vitals reviewed.    Depression Screen No flowsheet data found.    Assessment & Plan:     Routine Health Maintenance and Physical Exam  Exercise Activities and Dietary recommendations Goals    None      Immunization History  Administered Date(s) Administered  . Tdap 07/03/2005    Health Maintenance  Topic Date Due  . HIV Screening  07/15/1973  . PAP SMEAR  07/15/1976  . MAMMOGRAM  07/15/2008  . INFLUENZA VACCINE  10/12/2014  . TETANUS/TDAP  07/04/2015  . COLONOSCOPY  06/04/2018      Discussed health benefits of physical activity, and encouraged her to engage in regular exercise appropriate for her age and condition.  1. Routine general medical examination at a health care facility - CBC with Differential - Comprehensive metabolic panel - Lipid panel  2. Elevated hemoglobin A1c We'll check labs. Follow up pending labs. - Hemoglobin A1c  3. Colon cancer screening Hemoccult negative. Colonoscopy needs to be repeated in  2020. - POC Hemoccult Bld/Stl (1-Cd Office Dx)  4. Cervical cancer screening - Pap IG w/ reflex to HPV when ASC-U ([facility])  5. Breast cancer screening Previous mammograms negative. Will schedule annual mammogram - Mammogram Digital Screening; Future  6. Urinary frequency This is an ongoing problem. May be aggravated by the cystocele. She does not wish for urology referral at this time. UA positive with leukocytes. Will treat for UTI. - POCT Urinalysis Dipstick  7. Swelling of foot joint, left I feel this is most likely an osteoarthritis presentation. We will get x-rays of left foot to rule out stress fracture of the fifth metatarsal. - DG Foot Complete Left;  Future Update: The x-ray of the left foot showed no stress fractures. It did show spurring of the fifth metatarsal the calcaneus and some of the tarsal bones. This does correlate with the above diagnosis of possible osteoarthritis in the left foot. 8. Low TSH level TSH in 2015 was 0.333. Will recheck labs to see if it is stable. - TSH  9. Right hip pain Increased Q angle and possible hip osteoarthritis. We'll refer for physical therapy and manual manipulation to see if this helps her pain in the right hip. If not, may consider referral to orthopedics for further evaluation. - Ambulatory referral to Physical Therapy  10. Lumbago due to displacement of intervertebral disc Known history of L4\L5 disc protrusion that was seen on MRI in 2015. She has been seen by a neurosurgeon in May 2015 for this. He recommended physical therapy at that time but she refused. No other treatment has been done. He advised to see her back on an as-needed basis. - Ambulatory referral to Physical Therapy  11. Herpes labialis She has a history of cold sores. These occur when she goes out in the sun for prolonged periods of time or at times of increased stress. She needed a refill on her Valtrex to take when she has a flare. Valtrex refilled as below. - valACYclovir (VALTREX) 1000 MG tablet; Take 1 tablet (1,000 mg total) by mouth 2 (two) times daily.  Dispense: 30 tablet; Refill: 4  12. Shift work sleep disorder She works nights and has difficulty sleeping during the day on the days that she works. She states she was sleeping approximately 3-4 hours and then be up. She has tried doxepin 3 mg with success. She would like a refill of this. Doxepin 3 mg refilled as below. - Doxepin HCl 3 MG TABS; Take 1 tablet (3 mg total) by mouth at bedtime as needed.  Dispense: 30 tablet; Refill: 3  13. Acute cystitis without hematuria She was having urinary frequency. A UA done was done today in the office which showed positive  leukocytes. Will treat for UTI with Cipro 500 mg as below. She is to call the office if symptoms persist or do not improve after treatment. - ciprofloxacin (CIPRO) 500 MG tablet; Take 1 tablet (500 mg total) by mouth 2 (two) times daily.  Dispense: 10 tablet; Refill: 0  14. Cystocele A small cystocele was noted on physical exam today. She reports not knowing that she had a cystocele. This may contribute to the urine frequency that she has been having and urgency. She does not wish to see a urologist at this point for further workup. I will continue to monitor. If symptoms worsen she is to call the office and we will consider urology referral at that time.     --------------------------------------------------------------------

## 2014-10-02 ENCOUNTER — Telehealth: Payer: Self-pay

## 2014-10-02 ENCOUNTER — Other Ambulatory Visit
Admission: RE | Admit: 2014-10-02 | Discharge: 2014-10-02 | Disposition: A | Discharge status: Home / Self Care | Payer: 59 | Source: Ambulatory Visit | Attending: Physician Assistant | Admitting: Physician Assistant

## 2014-10-02 DIAGNOSIS — R5383 Other fatigue: Secondary | ICD-10-CM | POA: Diagnosis present

## 2014-10-02 DIAGNOSIS — Z0000 Encounter for general adult medical examination without abnormal findings: Secondary | ICD-10-CM | POA: Diagnosis present

## 2014-10-02 DIAGNOSIS — R7301 Impaired fasting glucose: Secondary | ICD-10-CM | POA: Diagnosis present

## 2014-10-02 LAB — TSH: TSH: 0.699 u[IU]/mL (ref 0.350–4.500)

## 2014-10-02 LAB — CBC WITH DIFFERENTIAL/PLATELET
BASOS PCT: 1 %
Basophils Absolute: 0.1 10*3/uL (ref 0–0.1)
EOS ABS: 0.3 10*3/uL (ref 0–0.7)
Eosinophils Relative: 5 %
HCT: 39.9 % (ref 35.0–47.0)
Hemoglobin: 13.2 g/dL (ref 12.0–16.0)
LYMPHS ABS: 1.7 10*3/uL (ref 1.0–3.6)
Lymphocytes Relative: 30 %
MCH: 30.1 pg (ref 26.0–34.0)
MCHC: 33.1 g/dL (ref 32.0–36.0)
MCV: 90.9 fL (ref 80.0–100.0)
Monocytes Absolute: 0.5 10*3/uL (ref 0.2–0.9)
Monocytes Relative: 9 %
NEUTROS PCT: 55 %
Neutro Abs: 3.1 10*3/uL (ref 1.4–6.5)
PLATELETS: 280 10*3/uL (ref 150–440)
RBC: 4.39 MIL/uL (ref 3.80–5.20)
RDW: 13.3 % (ref 11.5–14.5)
WBC: 5.8 10*3/uL (ref 3.6–11.0)

## 2014-10-02 LAB — LIPID PANEL
CHOLESTEROL: 225 mg/dL — AB (ref 0–200)
HDL: 87 mg/dL (ref >40)
LDL CALC: 124 mg/dL — AB (ref 0–99)
TRIGLYCERIDES: 70 mg/dL (ref <150)
Total CHOL/HDL Ratio: 2.6 RATIO
VLDL: 14 mg/dL (ref 0–40)

## 2014-10-02 LAB — COMPREHENSIVE METABOLIC PANEL
ALT: 18 U/L (ref 14–54)
ANION GAP: 9 (ref 5–15)
AST: 16 U/L (ref 15–41)
Albumin: 4.3 g/dL (ref 3.5–5.0)
Alkaline Phosphatase: 62 U/L (ref 38–126)
BUN: 17 mg/dL (ref 6–20)
CO2: 26 mmol/L (ref 22–32)
Calcium: 9.4 mg/dL (ref 8.9–10.3)
Chloride: 101 mmol/L (ref 101–111)
Creatinine, Ser: 0.67 mg/dL (ref 0.44–1.00)
GFR calc non Af Amer: >60 mL/min (ref >60)
Glucose, Bld: 107 mg/dL — ABNORMAL HIGH (ref 65–99)
POTASSIUM: 4.3 mmol/L (ref 3.5–5.1)
Sodium: 136 mmol/L (ref 135–145)
Total Bilirubin: 0.8 mg/dL (ref 0.3–1.2)
Total Protein: 7.7 g/dL (ref 6.5–8.1)

## 2014-10-02 NOTE — Telephone Encounter (Signed)
-----   Message from Mar Daring, [place] sent at 10/01/2014  3:52 PM EDT ----- Spurring is noted in the area of swelling possibly indicating early signs of osteoarthritis.  No stress fracture noted.

## 2014-10-02 NOTE — Telephone Encounter (Signed)
LMTCB  aa 

## 2014-10-02 NOTE — Telephone Encounter (Signed)
Pt is returning call.  WF#[phone]/KG

## 2014-10-03 LAB — HEMOGLOBIN A1C: Hgb A1c MFr Bld: 5.4 % (ref 4.0–6.0)

## 2014-10-05 ENCOUNTER — Telehealth: Payer: Self-pay

## 2014-10-05 NOTE — Telephone Encounter (Signed)
-----   Message from Mar Daring, [place] sent at 10/05/2014  8:20 AM EDT ----- Cholesterol is slightly elevated with total cholesterol at 225 and LDL at 124.  HDL is cardioprotective at 65 which is great! HgBA1c was normal at 5.4.  TSH is WNL now.  CMP and CBC were WNl and stable.  Continue to watch diet and decrease fatty foods and foods high in cholesterol.  Will recheck cholesterol in 6 months.

## 2014-10-05 NOTE — Telephone Encounter (Signed)
Advised pt of lab results. Pt verbally acknowledges understanding. Carolyn Maniscalco Drozdowski, CMA   

## 2014-10-06 ENCOUNTER — Ambulatory Visit: Payer: 59 | Attending: Physician Assistant

## 2014-10-06 DIAGNOSIS — M25551 Pain in right hip: Secondary | ICD-10-CM | POA: Diagnosis present

## 2014-10-06 DIAGNOSIS — M5441 Lumbago with sciatica, right side: Secondary | ICD-10-CM | POA: Diagnosis not present

## 2014-10-06 LAB — PAP IG W/ RFLX HPV ASCU: PAP SMEAR COMMENT: 0

## 2014-10-07 ENCOUNTER — Telehealth: Payer: Self-pay

## 2014-10-07 NOTE — Therapy (Signed)
[place] MAIN [facility] SERVICES [address] [place], [place], [address] Phone: [phone]   Fax:  [phone]  Physical Therapy Evaluation  Patient Details  Name: Sara Figueroa MRN: [id number] Date of Birth: Jun 17, 1960 Referring Provider:  Florian Buff*  Encounter Date: 10/06/2014      PT End of Session - 10/07/14 1013    Visit Number 1   Number of Visits 9   Date for PT Re-Evaluation 11/04/14   PT Start Time 1720   PT Stop Time 1815   PT Time Calculation (min) 55 min   Activity Tolerance Patient tolerated treatment well   Behavior During Therapy [facility] for tasks assessed/performed      Past Medical History  Diagnosis Date  . Allergy     seasonal    Past Surgical History  Procedure Laterality Date  . Refractive surgery  2008    Bil  . Cyst on eyebrow      right    There were no vitals filed for this visit.  Visit Diagnosis:  Hip pain, right - Plan: PT plan of care cert/re-cert  Right-sided low back pain with right-sided sciatica - Plan: PT plan of care cert/re-cert      Subjective Assessment - 10/06/14 1728    Subjective (p) pt reports trying to catch her father in the summer of 2012 from a fall when she was having lower back pain. pt continues to have pain extending now into the hip/leg starting 2015. pt now has noticed more weakness / pain in the R hip and has noticed that she is limping.  pt reports last year she saw a friend of her that was a PT who performed a MET to correct a rotation in her pelvis which hurt a lot.  pt reports she is not having back pain right now. she does report some knee pain which she attribues to walking different. pt denies any numbness/tingling recently. she does report having numbness/tingling last year to the 3-4 toes. pt reports her hip pain is lateral and towards the groin. pt reports her pain is typtically in weight baring. pt reports no symptoms when sititng, but she does feel pain  when she goes from sitting to standing in the back and hip. pt  reprots running , walking long distances, and going from sitting to rising aggrevates her symptoms.    How long can you sit comfortably? (p) no issue   How long can you walk comfortably? (p) variable    Diagnostic tests (p) xray 2015, MRI bulging discs   Currently in Pain? (p) Yes   Pain Score (p) 2    Pain Location (p) --  posterior hip   Pain Descriptors / Indicators (p) Aching            [facility] PT Assessment - 10/07/14 0957    Assessment   Medical Diagnosis displaced lumbar intervertebral disc   Onset Date/Surgical Date 10/07/10   Prior Therapy informal PT   Precautions   Precautions None   Balance Screen   Has the patient fallen in the past 6 months No   Has the patient had a decrease in activity level because of a fear of falling?  No   Is the patient reluctant to leave their home because of a fear of falling?  No   Home Environment   Living Environment Private residence   Prior Function   Level of Independence --  pt able to walk unlimited distances,  run 5k without pain   Vocation Full time employment  RN   Observation/Other Assessments   Observations --  fair posture, reduced lordosis in sitting   Sensation   Light Touch Appears Intact   ROM / Strength   AROM / PROM / Strength AROM;PROM;Strength   AROM   Overall AROM  Deficits   Overall AROM Comments lumbar flexion WNL., lumbar extension mod loss, sidebend WNL. R hip flexion WNL but painful, ER 20 deg, painful, IR 10 deg painful   Strength   Overall Strength Comments R hip flexion 4/5, L hip flex 4+/5, quad 5/5, hamstrings 5/5, hip abd 4+/5 bilaterally, hip adduction 4/5 bilaterally, hip extension 3+/5 bilaterally   Flexibility   Soft Tissue Assessment /Muscle Length yes   Hamstrings --  hamstrings/ hip flexors WNL, tight piriformis   Palpation   SI assessment  --  non painful,    Palpation comment not tender with lumbar CPA glides, not tender  over ITB, R greator trochanter, no palpable hip tenderness.    Special Tests    Special Tests Leg LengthTest;Sacrolliac Tests;Hip Special Tests   Sacroiliac Tests  Sacral Thrust   Leg length test  --  RLE .5cm longer   Hip Special Tests  Ober's Test;Hip Scouring;Thomas Test;Patrick (FABER) Test   Pelvic Dictraction   Findings Negative   Sacral thrust    Findings Negative   Saralyn Pilar (FABER) Test   Findings Positive   Side Right  painful   Thomas Test    Findings Negative   Side Right   Ober's Test   Findings Negative   Side Right   Hip Scouring   Findings Positive   Side Right   Comments painful   Ambulation/Gait   Gait Comments no gait deviations   Functional Gait  Assessment   Gait assessed  --      repeated lumbar flexion peripheralized pain to R hip Repeated lumbar extension abolished pain Pt has pain with passive R hip flexion IR, Pt performed PPU 2x10 with pt reports reduced pain in these to hip motions following                      PT Education - 10/07/14 1008    Education provided Yes   Education Details posture ed, principles of centralization/peripheralization   Person(s) Educated Patient   Methods Explanation   Comprehension Verbalized understanding             PT Long Term Goals - 10/07/14 1019    PT LONG TERM GOAL #1   Title pt will improve glut strength to 4/5 to improve hip and lumbar stability during running    Time 4   Period Weeks   Status New   PT LONG TERM GOAL #2   Title pt will be able to walk x 3 miles without increased symptoms to return to PLOF.   Time 4   Period Weeks   Status New   PT LONG TERM GOAL #3   Title pt will sit with lumbar support in place x  2hours and be able to stand up from her desk with <3/10 pain in the hip   Time 4   Period Weeks   Status New               Plan - 10/07/14 1013    Clinical Impression Statement pt presents with chronic history of episodic LBP with R sided  sciatica. She also complians to  R sided  hip pain, stiffness, weakness associated with activity. pt did demonstrate + peripheralized symtpoms into the R hip with repeated lumbar flexion, then abolishing these sypmtoms with repeated lumbar flexion suggesting lumbar derangement with extension preference. pt did have some reduced hip sypmtoms into hip flexion, IR after repeated lumbar extension as well, however  also demonstrated capsular pattern and + hip scour test which can be associated with OA. unclear if pts hip compliant and sciatic symptoms are directly releated or not at this point. pt was issued repeated lumbar extension for HEP today to reassess symptoms at next visit.    Pt will benefit from skilled therapeutic intervention in order to improve on the following deficits Decreased endurance;Hypomobility;Decreased strength;Pain;Increased muscle spasms;Difficulty walking;Decreased mobility;Decreased range of motion;Impaired flexibility;Postural dysfunction   Rehab Potential Good   Clinical Impairments Affecting Rehab Potential pt reports she may only be able to attend PT 1x/week   PT Frequency 2x / week   PT Duration 4 weeks   PT Treatment/Interventions Aquatic Therapy;Cryotherapy;Electrical Stimulation;Moist Heat;Traction;Therapeutic exercise;Therapeutic activities;Neuromuscular re-education;Patient/family education;Manual techniques;Dry needling;Passive range of motion         Problem List Patient Active Problem List   Diagnosis Date Noted  . Elevated hemoglobin A1c 10/01/2014  . Urinary frequency 10/01/2014  . Low TSH level 10/01/2014  . Right hip pain 10/01/2014  . Lumbago due to displacement of intervertebral disc 10/01/2014  . Shift work sleep disorder 10/01/2014  . Cystocele 10/01/2014  . Absolute anemia 09/11/2014  . Bradycardia 09/11/2014  . Compression fracture 09/11/2014  . Fracture of lumbar spine 09/11/2014  . Cold sore 09/11/2014  . Cannot sleep 09/11/2014  . Personal  history of urinary calculi 09/11/2014  . Asymptomatic postmenopausal status 09/11/2014   Gorden Harms. Jonella Redditt, PT, DPT [phone]  Ajanae Virag 10/07/2014, 10:25 AM  [place] MAIN [facility] SERVICES [address] [place], [place], [address] Phone: [phone]   Fax:  [phone]

## 2014-10-07 NOTE — Telephone Encounter (Signed)
Left patient a detailed message on cell voicemail (consent in chart) advising her that results were normal.

## 2014-10-07 NOTE — Telephone Encounter (Signed)
-----   Message from Mar Daring, PA-C sent at 10/07/2014  8:14 AM EDT ----- Pap is normal.

## 2014-10-07 NOTE — Patient Instructions (Signed)
[email or web address] Prone press up or repeated lumbar extension in standing x 10 bi-hourly (waking hours) Lumbar support in sitting

## 2014-10-08 ENCOUNTER — Ambulatory Visit: Payer: 59 | Attending: Physician Assistant | Admitting: Physical Therapy

## 2014-10-08 ENCOUNTER — Ambulatory Visit: Payer: 59 | Admitting: Physical Therapy

## 2014-10-08 DIAGNOSIS — M5441 Lumbago with sciatica, right side: Secondary | ICD-10-CM | POA: Insufficient documentation

## 2014-10-08 DIAGNOSIS — M25551 Pain in right hip: Secondary | ICD-10-CM | POA: Diagnosis not present

## 2014-10-09 NOTE — Therapy (Signed)
[place] [facility] [address] [place], [place], [address] Phone: [phone]   Fax:  [phone]  Physical Therapy Treatment  Patient Details  Name: Sara Figueroa MRN: [id number] Date of Birth: March 12, 1961 Referring Provider:  Florian Buff*  Encounter Date: 10/08/2014      PT End of Session - 10/09/14 2037    Visit Number 2   Number of Visits 9   Date for PT Re-Evaluation 11/04/14   PT Start Time 1301   PT Stop Time 1355   PT Time Calculation (min) 54 min   Activity Tolerance Patient tolerated treatment well   Behavior During Therapy [facility] for tasks assessed/performed      Past Medical History  Diagnosis Date  . Allergy     seasonal    Past Surgical History  Procedure Laterality Date  . Refractive surgery  2008    Bil  . Cyst on eyebrow      right    There were no vitals filed for this visit.  Visit Diagnosis:  Hip pain, right  Right-sided low back pain with right-sided sciatica      Subjective Assessment - 10/08/14 1302    Subjective No pain reported at this time but increase R back/SI pain with initial standing/ walking up hill to PT clinic.     Limitations Sitting;Standing;Walking   Patient Stated Goals decrease back pain.     Currently in Pain? No/denies        OBJECTIVE:  Manual tx.: supine and prone LE/lumbar generalized stretches 9 min.  Muscle energy to pelvic (R hip flexion resisted isometric/ L hamstring isometric).  Supine lumbar PA grade II-III mobs. (UPA/CPA) 2x15 sec.  Therex: issued page 1-3 TrA core muscle ex. Program (able to progress to ex. #4).      Pt response for medical necessity:  Good tx. Tolerance with muscle energy/ pelvic correction but core muscle weakness limited stability/ pelvic alignment in standing/walking posture.           PT Long Term Goals - 10/07/14 1019    PT LONG TERM GOAL #1   Title pt will improve glut strength to 4/5 to improve hip and lumbar  stability during running    Time 4   Period Weeks   Status New   PT LONG TERM GOAL #2   Title pt will be able to walk x 3 miles without increased symptoms to return to PLOF.   Time 4   Period Weeks   Status New   PT LONG TERM GOAL #3   Title pt will sit with lumbar support in place x  2hours and be able to stand up from her desk with <3/10 pain in the hip   Time 4   Period Weeks   Status New           Plan - 10/09/14 2038    Clinical Impression Statement (+) R hip IR/ER tightness, with capsular tightness all planes.  (+) R hip scour test.  Muscle energy/ pelvic correction helps but once pt. standing pelvic assymetry returns.  Pt. has moderate core muscle/ hip weakness and may benefit from use of lift to L shoulder in 1-2 weeks if no benefiits from conservative tx.     Pt will benefit from skilled therapeutic intervention in order to improve on the following deficits Decreased endurance;Hypomobility;Decreased strength;Pain;Increased muscle spasms;Difficulty walking;Decreased mobility;Decreased range of motion;Impaired flexibility;Postural dysfunction   Rehab Potential Good   Clinical Impairments Affecting Rehab  Potential pt reports she may only be able to attend PT 1x/week   PT Frequency 2x / week   PT Duration 4 weeks   PT Treatment/Interventions Aquatic Therapy;Cryotherapy;Electrical Stimulation;Moist Heat;Traction;Therapeutic exercise;Therapeutic activities;Neuromuscular re-education;Patient/family education;Manual techniques;Dry needling;Passive range of motion   PT Next Visit Plan reassess LLD/ pelvic symmetry   PT Home Exercise Plan see handouts   Consulted and Agree with Plan of Care Patient        Problem List Patient Active Problem List   Diagnosis Date Noted  . Elevated hemoglobin A1c 10/01/2014  . Urinary frequency 10/01/2014  . Low TSH level 10/01/2014  . Right hip pain 10/01/2014  . Lumbago due to displacement of intervertebral disc 10/01/2014  . Shift work  sleep disorder 10/01/2014  . Cystocele 10/01/2014  . Absolute anemia 09/11/2014  . Bradycardia 09/11/2014  . Compression fracture 09/11/2014  . Fracture of lumbar spine 09/11/2014  . Cold sore 09/11/2014  . Cannot sleep 09/11/2014  . Personal history of urinary calculi 09/11/2014  . Asymptomatic postmenopausal status 09/11/2014   Pura Spice, PT, DPT # [phone]   10/09/2014, 8:44 PM  [facility] [address] [place], [place], [address] Phone: [phone]   Fax:  [phone]

## 2014-10-12 ENCOUNTER — Ambulatory Visit: Payer: 59 | Attending: Physician Assistant | Admitting: Physical Therapy

## 2014-10-12 DIAGNOSIS — M25551 Pain in right hip: Secondary | ICD-10-CM | POA: Insufficient documentation

## 2014-10-12 DIAGNOSIS — M5441 Lumbago with sciatica, right side: Secondary | ICD-10-CM | POA: Insufficient documentation

## 2014-10-12 NOTE — Therapy (Signed)
[facility] [address] [place], [place], [address] Phone: [phone]   Fax:  [phone]  Physical Therapy Treatment  Patient Details  Name: Sara Figueroa MRN: [id number] Date of Birth: 10/28/1960 Referring Provider:  Florian Buff*  Encounter Date: 10/12/2014      PT End of Session - 10/13/14 0837    Visit Number 3   Number of Visits 9   Date for PT Re-Evaluation 11/04/14   PT Start Time 1344   PT Stop Time 1429   PT Time Calculation (min) 45 min   Activity Tolerance Patient tolerated treatment well   Behavior During Therapy [facility] for tasks assessed/performed      Past Medical History  Diagnosis Date  . Allergy     seasonal    Past Surgical History  Procedure Laterality Date  . Refractive surgery  2008    Bil  . Cyst on eyebrow      right    There were no vitals filed for this visit.  Visit Diagnosis:  Hip pain, right  Right-sided low back pain with right-sided sciatica      Subjective Assessment - 10/13/14 0836    Subjective Pt. reports no pain currently and states she have been compliant with core stability.  Pt. is stopping at bridging ex. currently due to lack of TrA muscle control.    Limitations Sitting;Standing;Walking   Patient Stated Goals decrease back pain.     Currently in Pain? No/denies      OBJECTIVE: Manual tx.: supine and prone LE/lumbar generalized stretches 17 min. Muscle energy to pelvic (R hip flexion resisted isometric/ L hamstring isometric). Supine lumbar PA grade II-III mobs. (UPA/CPA) 2x15 sec. Therex: reviewed page 1-3 TrA core muscle ex. Program (able to progress to bridging with no issues/pain).    Pt response for medical necessity: Good tx. Tolerance with muscle energy/ pelvic correction but core muscle weakness limited stability/ pelvic alignment in standing/walking posture.          PT Long Term Goals - 10/07/14 1019    PT LONG TERM GOAL #1    Title pt will improve glut strength to 4/5 to improve hip and lumbar stability during running    Time 4   Period Weeks   Status New   PT LONG TERM GOAL #2   Title pt will be able to walk x 3 miles without increased symptoms to return to PLOF.   Time 4   Period Weeks   Status New   PT LONG TERM GOAL #3   Title pt will sit with lumbar support in place x  2hours and be able to stand up from her desk with <3/10 pain in the hip   Time 4   Period Weeks   Status New           Plan - 10/13/14 1517    Clinical Impression Statement R hip joint has significant capsule tightness/ (+) scour test.  Limited IR/ER/flexion in supine position during manual tx.  Progressing well with core stability with proper technique and minimal curing for TrA contraction.  Pt. instructed to continue with HEP as tolerated.     Pt will benefit from skilled therapeutic intervention in order to improve on the following deficits Decreased endurance;Hypomobility;Decreased strength;Pain;Increased muscle spasms;Difficulty walking;Decreased mobility;Decreased range of motion;Impaired flexibility;Postural dysfunction   Rehab Potential Good   Clinical Impairments Affecting Rehab Potential pt reports she may only be able to attend PT 1x/week  PT Frequency 2x / week   PT Duration 4 weeks   PT Treatment/Interventions Aquatic Therapy;Cryotherapy;Electrical Stimulation;Moist Heat;Traction;Therapeutic exercise;Therapeutic activities;Neuromuscular re-education;Patient/family education;Manual techniques;Dry needling;Passive range of motion   PT Next Visit Plan reassess LLD/ pelvic symmetry.  Discuss core stability ex.   PT Home Exercise Plan see handouts   Consulted and Agree with Plan of Care Patient        Problem List Patient Active Problem List   Diagnosis Date Noted  . Elevated hemoglobin A1c 10/01/2014  . Urinary frequency 10/01/2014  . Low TSH level 10/01/2014  . Right hip pain 10/01/2014  . Lumbago due to  displacement of intervertebral disc 10/01/2014  . Shift work sleep disorder 10/01/2014  . Cystocele 10/01/2014  . Absolute anemia 09/11/2014  . Bradycardia 09/11/2014  . Compression fracture 09/11/2014  . Fracture of lumbar spine 09/11/2014  . Cold sore 09/11/2014  . Cannot sleep 09/11/2014  . Personal history of urinary calculi 09/11/2014  . Asymptomatic postmenopausal status 09/11/2014   Pura Spice, PT, DPT # [phone]   10/13/2014, 8:42 AM  [place] [facility] [address] [place], [place], [address] Phone: [phone]   Fax:  [phone]

## 2014-10-15 ENCOUNTER — Encounter: Payer: 59 | Admitting: Physical Therapy

## 2014-10-15 ENCOUNTER — Ambulatory Visit: Payer: 59 | Admitting: Physical Therapy

## 2014-10-15 ENCOUNTER — Encounter: Payer: Self-pay | Admitting: Physical Therapy

## 2014-10-15 DIAGNOSIS — M5441 Lumbago with sciatica, right side: Secondary | ICD-10-CM

## 2014-10-15 DIAGNOSIS — M25551 Pain in right hip: Secondary | ICD-10-CM | POA: Diagnosis not present

## 2014-10-15 NOTE — Therapy (Signed)
[place] [facility] [address] [place], [place], [address] Phone: [phone]   Fax:  [phone]  Physical Therapy Treatment  Patient Details  Name: Sara Figueroa MRN: [id number] Date of Birth: 1960-04-16 Referring Provider:  Florian Buff*  Encounter Date: 10/15/2014      PT End of Session - 10/15/14 1603    Visit Number 4   Number of Visits 9   Date for PT Re-Evaluation 11/04/14   PT Start Time 6962   PT Stop Time 9528   PT Time Calculation (min) 38 min   Activity Tolerance Patient tolerated treatment well   Behavior During Therapy [facility] for tasks assessed/performed      Past Medical History  Diagnosis Date  . Allergy     seasonal    Past Surgical History  Procedure Laterality Date  . Refractive surgery  2008    Bil  . Cyst on eyebrow      right    There were no vitals filed for this visit.  Visit Diagnosis:  Hip pain, right  Right-sided low back pain with right-sided sciatica      Subjective Assessment - 10/15/14 1600    Subjective Pt reports no pain but notice some soreness with traveling today. Pt is able to perform HEP without any problems for pages 1-2 without losing her TrA contraction.    Limitations Sitting;Standing;Walking   Patient Stated Goals decrease back pain.     Currently in Pain? No/denies     OBJECTIVE: Manual tx.: supine and prone LE/lumbar generalized stretches. Prone hip PA mobs grade III with neutral and frog leg alignment 3 x 30 seconds each 16 min.  Therex: Quadruped: cow/cat, alternating arm lifts, alternating arm/leg lifts x 10 each. Prone planks on knees with 30 second hold x 3. Hip flexor stretch in tall kneeling with 20 second hold x 3. See handouts.  Pt response for medical necessity: Good pelvic alignment in standing/walking posture and with all quadruped TrA activation today. Pt continues to have limited stability due to core weakness.        PT Long Term  Goals - 10/07/14 1019    PT LONG TERM GOAL #1   Title pt will improve glut strength to 4/5 to improve hip and lumbar stability during running    Time 4   Period Weeks   Status New   PT LONG TERM GOAL #2   Title pt will be able to walk x 3 miles without increased symptoms to return to PLOF.   Time 4   Period Weeks   Status New   PT LONG TERM GOAL #3   Title pt will sit with lumbar support in place x  2hours and be able to stand up from her desk with <3/10 pain in the hip   Time 4   Period Weeks   Status New     Problem List Patient Active Problem List   Diagnosis Date Noted  . Elevated hemoglobin A1c 10/01/2014  . Urinary frequency 10/01/2014  . Low TSH level 10/01/2014  . Right hip pain 10/01/2014  . Lumbago due to displacement of intervertebral disc 10/01/2014  . Shift work sleep disorder 10/01/2014  . Cystocele 10/01/2014  . Absolute anemia 09/11/2014  . Bradycardia 09/11/2014  . Compression fracture 09/11/2014  . Fracture of lumbar spine 09/11/2014  . Cold sore 09/11/2014  . Cannot sleep 09/11/2014  . Personal history of urinary calculi 09/11/2014  . Asymptomatic postmenopausal status 09/11/2014  Pura Spice, PT, DPT # [id number]   10/15/2014, 4:12 PM  [place] [facility] [address] [place], [place], [address] Phone: [phone]   Fax:  [phone]

## 2014-10-19 ENCOUNTER — Ambulatory Visit: Payer: 59 | Admitting: Physical Therapy

## 2014-10-19 ENCOUNTER — Encounter: Payer: Self-pay | Admitting: Physical Therapy

## 2014-10-19 DIAGNOSIS — M5441 Lumbago with sciatica, right side: Secondary | ICD-10-CM

## 2014-10-19 DIAGNOSIS — M25551 Pain in right hip: Secondary | ICD-10-CM

## 2014-10-19 NOTE — Therapy (Signed)
[place] [facility] [address] [place], [place], [address] Phone: [phone]   Fax:  [phone]  Physical Therapy Treatment  Patient Details  Name: Sara Figueroa MRN: [id number] Date of Birth: [birth year] Referring Provider:  Mar Daring, Mamie Nick*  Encounter Date: 10/19/2014      PT End of Session - 10/19/14 1729    Visit Number 5   Number of Visits 9   Date for PT Re-Evaluation 11/04/14   PT Start Time 4709   PT Stop Time 1435   PT Time Calculation (min) 56 min   Activity Tolerance Patient tolerated treatment well;No increased pain   Behavior During Therapy [facility] for tasks assessed/performed      Past Medical History  Diagnosis Date  . Allergy     seasonal    Past Surgical History  Procedure Laterality Date  . Refractive surgery  2008    Bil  . Cyst on eyebrow      right    There were no vitals filed for this visit.  Visit Diagnosis:  Hip pain, right  Right-sided low back pain with right-sided sciatica      Subjective Assessment - 10/19/14 1728    Subjective Pt. entered PT with no new complaints.  Pt. continues to report R hip stiffness and remained active with family activities out of town all weekend.      Limitations Sitting;Standing;Walking   Patient Stated Goals decrease back pain.     Currently in Pain? No/denies       OBJECTIVE: Manual tx.: supine and prone LE/lumbar generalized stretches (pain tolerable with static holds). Supine AP hip mobs  grade III with neutral alignment and belt mobilization, lateral at 90 degrees (noted increased R hip abduction, still discomfort with mobs) 3 x 20 seconds each. Therex: Green theraball: TrA activation: pelvic tilts/side to side/marching/straight leg raise/opposite arm and leg x 10 each. Scapular retraction on theraball with no resistance. Pt educated on use of ball at home and will provide handouts next session. Pt response for medical necessity: Good  pelvic alignment in standing/walking posture and with core TrA activation on theraball.         PT Education - 10/19/14 1729    Education provided Yes   Education Details Diona Foley ex./ seated posture   Person(s) Educated Patient   Methods Explanation;Demonstration   Comprehension Verbalized understanding;Returned demonstration           PT Long Term Goals - 10/07/14 1019    PT LONG TERM GOAL #1   Title pt will improve glut strength to 4/5 to improve hip and lumbar stability during running    Time 4   Period Weeks   Status New   PT LONG TERM GOAL #2   Title pt will be able to walk x 3 miles without increased symptoms to return to PLOF.   Time 4   Period Weeks   Status New   PT LONG TERM GOAL #3   Title pt will sit with lumbar support in place x  2hours and be able to stand up from her desk with <3/10 pain in the hip   Time 4   Period Weeks   Status New            Plan - 10/19/14 1731    Clinical Impression Statement R hip ER increased slightly after mobs/ static stretches but remains limited/hypomobile.  Difficulty tolerating hand/belt positioning duirng mobs in supine position.  Pt. demonstrates good core  stability/ upright posture on theraball with minimal postural correction.  Good pelvic/lower leg alignment during supine stretches/ core ball ex.  R hip extension limited during L LE swing through secondary to hip anterior capsule tightness.     Pt will benefit from skilled therapeutic intervention in order to improve on the following deficits Decreased endurance;Hypomobility;Decreased strength;Pain;Increased muscle spasms;Difficulty walking;Decreased mobility;Decreased range of motion;Impaired flexibility;Postural dysfunction;Abnormal gait   Rehab Potential Good   PT Frequency 2x / week   PT Duration 4 weeks   PT Treatment/Interventions Aquatic Therapy;Cryotherapy;Electrical Stimulation;Moist Heat;Traction;Therapeutic exercise;Therapeutic activities;Neuromuscular  re-education;Patient/family education;Manual techniques;Dry needling;Passive range of motion;Gait training   PT Next Visit Plan check on HEP with progressed core stability,  re assess hip mobilizations.  Add Ball Ex., TG and more gym based ex.    PT Home Exercise Plan handouts for quadruped and prone planks, hip stretches    Consulted and Agree with Plan of Care Patient        Problem List Patient Active Problem List   Diagnosis Date Noted  . Elevated hemoglobin A1c 10/01/2014  . Urinary frequency 10/01/2014  . Low TSH level 10/01/2014  . Right hip pain 10/01/2014  . Lumbago due to displacement of intervertebral disc 10/01/2014  . Shift work sleep disorder 10/01/2014  . Cystocele 10/01/2014  . Absolute anemia 09/11/2014  . Bradycardia 09/11/2014  . Compression fracture 09/11/2014  . Fracture of lumbar spine 09/11/2014  . Cold sore 09/11/2014  . Cannot sleep 09/11/2014  . Personal history of urinary calculi 09/11/2014  . Asymptomatic postmenopausal status 09/11/2014   Pura Spice, PT, DPT # [phone]   10/19/2014, 5:43 PM  [place] [address] [place], [place], [address] Phone: [phone]   Fax:  [phone]

## 2014-10-22 ENCOUNTER — Ambulatory Visit: Payer: 59 | Admitting: Physical Therapy

## 2014-10-22 DIAGNOSIS — M25551 Pain in right hip: Secondary | ICD-10-CM

## 2014-10-22 DIAGNOSIS — M5441 Lumbago with sciatica, right side: Secondary | ICD-10-CM

## 2014-10-23 ENCOUNTER — Encounter: Payer: Self-pay | Admitting: Physical Therapy

## 2014-10-23 NOTE — Therapy (Signed)
[place] [facility] [address] [place], [place], [address] Phone: [phone]   Fax:  [phone]  Physical Therapy Treatment  Patient Details  Name: Sara Figueroa MRN: [id number] Date of Birth: 18-Jan-1961 Referring Provider:  Mar Daring, Mamie Nick*  Encounter Date: 10/22/2014      PT End of Session - 10/23/14 1122    Visit Number 6   Number of Visits 9   Date for PT Re-Evaluation 11/04/14   PT Start Time 1341   PT Stop Time 8250   PT Time Calculation (min) 57 min   Activity Tolerance Patient tolerated treatment well;No increased pain   Behavior During Therapy [facility] for tasks assessed/performed      Past Medical History  Diagnosis Date  . Allergy     seasonal    Past Surgical History  Procedure Laterality Date  . Refractive surgery  2008    Bil  . Cyst on eyebrow      right    There were no vitals filed for this visit.  Visit Diagnosis:  Hip pain, right  Right-sided low back pain with right-sided sciatica      Subjective Assessment - 10/23/14 1118    Subjective Pt. states she was doing well after last PT tx. but had slight increase c/o back pain past 2 days.  No c/o pain at this time.  Pt. attempted to use ball at home but it has a hole in it.  Pt. states she will be on vacation next week at beach.     Limitations Sitting;Standing;Walking   Patient Stated Goals decrease back pain.     Currently in Pain? No/denies      OBJECTIVE: Manual tx.: supine and prone LE/lumbar generalized stretches (pain tolerable with static holds). Supine AP hip mobs grade III with neutral alignment and 3 x 20 seconds each. Prone R hip PA grade III mobs. (significant joint stiffness noted).  Pain limited R hip ER. Therex: Green theraball: TrA activation: pelvic tilts/side to side/marching/straight leg raise/opposite arm and leg x 10 each. Scapular retraction on theraball with no resistance. Pt educated on use of ball at home (see  handouts).  Pt response for medical necessity: Good pelvic alignment in standing/walking posture and with core TrA activation on theraball.          PT Long Term Goals - 10/07/14 1019    PT LONG TERM GOAL #1   Title pt will improve glut strength to 4/5 to improve hip and lumbar stability during running    Time 4   Period Weeks   Status New   PT LONG TERM GOAL #2   Title pt will be able to walk x 3 miles without increased symptoms to return to PLOF.   Time 4   Period Weeks   Status New   PT LONG TERM GOAL #3   Title pt will sit with lumbar support in place x  2hours and be able to stand up from her desk with <3/10 pain in the hip   Time 4   Period Weeks   Status New               Plan - 10/23/14 1123    Clinical Impression Statement R hip mobility remains limited with capsule hypomobility.  Pain limited R hip ER.  Good pelvic symmetry/ alignment after LE/lumbar manual stretches.  Progressing core stability with addition of progressive ball ex. program for home.     Pt will benefit  from skilled therapeutic intervention in order to improve on the following deficits Decreased endurance;Hypomobility;Decreased strength;Pain;Increased muscle spasms;Difficulty walking;Decreased mobility;Decreased range of motion;Impaired flexibility;Postural dysfunction;Abnormal gait   Rehab Potential Good   PT Frequency 2x / week   PT Duration 4 weeks   PT Treatment/Interventions Aquatic Therapy;Cryotherapy;Electrical Stimulation;Moist Heat;Traction;Therapeutic exercise;Therapeutic activities;Neuromuscular re-education;Patient/family education;Manual techniques;Dry needling;Passive range of motion;Gait training   PT Next Visit Plan Discuss back during vacation.  Probable discharge if independent/ pain-free.  Issue progressive HEP.     PT Home Exercise Plan Modified/ simplified HEP.  Added ball ex.    Consulted and Agree with Plan of Care Patient        Problem List Patient  Active Problem List   Diagnosis Date Noted  . Elevated hemoglobin A1c 10/01/2014  . Urinary frequency 10/01/2014  . Low TSH level 10/01/2014  . Right hip pain 10/01/2014  . Lumbago due to displacement of intervertebral disc 10/01/2014  . Shift work sleep disorder 10/01/2014  . Cystocele 10/01/2014  . Absolute anemia 09/11/2014  . Bradycardia 09/11/2014  . Compression fracture 09/11/2014  . Fracture of lumbar spine 09/11/2014  . Cold sore 09/11/2014  . Cannot sleep 09/11/2014  . Personal history of urinary calculi 09/11/2014  . Asymptomatic postmenopausal status 09/11/2014   Pura Spice, PT, DPT # [id number]   10/23/2014, 11:30 AM  [place] [facility] [address] [place], [place], [address] Phone: [phone]   Fax:  [phone]

## 2014-11-02 ENCOUNTER — Ambulatory Visit: Payer: 59 | Admitting: Physical Therapy

## 2014-11-02 ENCOUNTER — Encounter: Payer: Self-pay | Admitting: Physical Therapy

## 2014-11-02 DIAGNOSIS — M25551 Pain in right hip: Secondary | ICD-10-CM

## 2014-11-02 DIAGNOSIS — M5441 Lumbago with sciatica, right side: Secondary | ICD-10-CM

## 2014-11-02 NOTE — Therapy (Signed)
[facility] [address]. [place], [place], [address] Phone: [phone]   Fax:  [phone]  Physical Therapy Treatment  Patient Details  Name: Sara Figueroa MRN: [id number] Date of Birth: 06-Jul-1960 Referring Provider:  Mar Daring, Mamie Nick*  Encounter Date: 11/02/2014      PT End of Session - 11/02/14 1435    Visit Number 7   Number of Visits 9   Date for PT Re-Evaluation 11/04/14   PT Start Time 0488   PT Stop Time 1420   PT Time Calculation (min) 47 min   Activity Tolerance Patient tolerated treatment well;No increased pain   Behavior During Therapy [facility] for tasks assessed/performed      Past Medical History  Diagnosis Date  . Allergy     seasonal    Past Surgical History  Procedure Laterality Date  . Refractive surgery  2008    Bil  . Cyst on eyebrow      right    There were no vitals filed for this visit.  Visit Diagnosis:  Hip pain, right  Right-sided low back pain with right-sided sciatica      Subjective Assessment - 11/02/14 1433    Subjective Pt reports soreness after riding in the car for 4 hours. Pt reports [place] soreness at the beach over the weekend from not doing HEP and moving things in the active. Pt reports performing exercises this morning and able to do 12 squats this AM.    Limitations Sitting;Standing;Walking   Patient Stated Goals decrease back pain.     Currently in Pain? No/denies       OBJECTIVE:  LEFS: 58/80 ODI: 28% self-perceived moderate disability.  Manual tx.: supine and prone LE/lumbar generalized stretches (pain tolerable with static holds). Supine AP hip mobs grade III with neutral alignment and 4 x 20 seconds each. Prone R hip PA grade III mobs 4 x 20 seconds.  (joint stiffness noted). Pt able to cross right foot over left leg in long sitting. Therex: Green theraball: TrA activation: straight leg raise/opposite arm and leg x 20 each. Wall squats with green therapy  ball behind her low back x 30.  Pt response for medical necessity: Good pelvic alignment with all activities.  Marked improvement noted with gait pattern/ pelvic symmetry.  Patient progressing towards independence with HEP.          PT Long Term Goals - 10/07/14 1019    PT LONG TERM GOAL #1   Title pt will improve glut strength to 4/5 to improve hip and lumbar stability during running    Time 4   Period Weeks   Status New   PT LONG TERM GOAL #2   Title pt will be able to walk x 3 miles without increased symptoms to return to PLOF.   Time 4   Period Weeks   Status New   PT LONG TERM GOAL #3   Title pt will sit with lumbar support in place x  2hours and be able to stand up from her desk with <3/10 pain in the hip   Time 4   Period Weeks   Status New            Plan - 11/02/14 1436    Clinical Impression Statement R hip mobility improved compared to last visit but still noted capsule hypomobillity. Pt maintained pelvic symmetry throughout all exercises standing and on therapy ball. Patient still waiting to get a ball at home. Patient  ambulates with normal ized gait. Will reassess patient goals and HEP next session.    Pt will benefit from skilled therapeutic intervention in order to improve on the following deficits Decreased endurance;Hypomobility;Decreased strength;Pain;Increased muscle spasms;Difficulty walking;Decreased mobility;Decreased range of motion;Impaired flexibility;Postural dysfunction;Abnormal gait   Rehab Potential Good   PT Frequency 2x / week   PT Duration 4 weeks   PT Treatment/Interventions Aquatic Therapy;Cryotherapy;Electrical Stimulation;Moist Heat;Traction;Therapeutic exercise;Therapeutic activities;Neuromuscular re-education;Patient/family education;Manual techniques;Dry needling;Passive range of motion;Gait training   PT Next Visit Plan Reassess goals, proable discharge if pain free while progressive HEP.    PT Home Exercise Plan HEP  continues to be quadraped and advanced core stability in supine. Ball exercises issued when patient has a ball.    Consulted and Agree with Plan of Care Patient        Problem List Patient Active Problem List   Diagnosis Date Noted  . Elevated hemoglobin A1c 10/01/2014  . Urinary frequency 10/01/2014  . Low TSH level 10/01/2014  . Right hip pain 10/01/2014  . Lumbago due to displacement of intervertebral disc 10/01/2014  . Shift work sleep disorder 10/01/2014  . Cystocele 10/01/2014  . Absolute anemia 09/11/2014  . Bradycardia 09/11/2014  . Compression fracture 09/11/2014  . Fracture of lumbar spine 09/11/2014  . Cold sore 09/11/2014  . Cannot sleep 09/11/2014  . Personal history of urinary calculi 09/11/2014  . Asymptomatic postmenopausal status 09/11/2014   Pura Spice, PT, DPT # [id number]   11/02/2014, 2:41 PM  [place] [facility] [address] [place], [place], [address] Phone: [phone]   Fax:  [phone]

## 2014-11-04 ENCOUNTER — Ambulatory Visit: Payer: 59 | Admitting: Physical Therapy

## 2014-11-04 ENCOUNTER — Encounter: Payer: Self-pay | Admitting: Physical Therapy

## 2014-11-04 DIAGNOSIS — M25551 Pain in right hip: Secondary | ICD-10-CM | POA: Diagnosis not present

## 2014-11-04 DIAGNOSIS — M5441 Lumbago with sciatica, right side: Secondary | ICD-10-CM

## 2014-11-04 NOTE — Therapy (Signed)
[facility] [address] [place], [place], [address] Phone: [phone]   Fax:  [phone]  Physical Therapy Treatment  Patient Details  Name: Sara Figueroa MRN: [id number] Date of Birth: May 05, 1960 Referring Provider:  Mar Daring, Mamie Nick*  Encounter Date: 11/04/2014      PT End of Session - 11/04/14 1748    Visit Number 8   Number of Visits 9   Date for PT Re-Evaluation 11/04/14   PT Start Time 7564   PT Stop Time 1432   PT Time Calculation (min) 40 min   Activity Tolerance Patient tolerated treatment well;No increased pain   Behavior During Therapy [facility] for tasks assessed/performed      Past Medical History  Diagnosis Date  . Allergy     seasonal    Past Surgical History  Procedure Laterality Date  . Refractive surgery  2008    Bil  . Cyst on eyebrow      right    There were no vitals filed for this visit.  Visit Diagnosis:  Hip pain, right  Right-sided low back pain with right-sided sciatica      Subjective Assessment - 11/04/14 1746    Subjective Pt reports no pain upon arrival to PT session. Pt reports doing HEP and not having any problems with it. Pt states that she is now able to reach her R foot across her L leg when doing a long sit piriformis stretch without discomfort.    Limitations Lifting;Standing;Walking   Patient Stated Goals decrease back pain/hip pain/increase core strength    Currently in Pain? No/denies        OBJECTIVE: R glut med strength 4/5 with MMT.  Manual: B LE/lumbar stretching. Grade III hip mobilization AP and PA 5 x 30 seconds (no charge). There ex: seated on green therapy ball: SLR/marching/alternating arm and leg 2 x 15. In quadraped with neutral pelvic alignment, alternating arm and leg 2 x 15. Plank on knees and elbows/plank on toes and elbows/side planks on elbows and knees x 30 seconds each.  Reviewed entire HEP.  Pt response to Tx for medical necessity: Pt is able  to maintain neutral pelvic alignment with all mobility and has decreased her back pain. At this time, pt is discharged from skilled PT to independent home exercise program.         PT Education - 11/04/14 1747    Education provided Yes   Education Details Pt educated on parts of core stability packet to perform. Pt educated on progression of planks and ball exercises for when she gets a ball.    Person(s) Educated Patient   Methods Explanation;Demonstration   Comprehension Returned demonstration;Verbalized understanding             PT Long Term Goals - 11/04/14 1751    PT LONG TERM GOAL #1   Title Pt will improve glut strength to 4/5 to improve hip and lumabr stability with running.    Baseline 3/5 on eval; 8/24 4/5   Time 4   Period Weeks   Status Achieved   PT LONG TERM GOAL #2   Title Pt will walk x 3 miles without increase of symptoms to return to her PLOF.    Baseline pt is currently walking shorter distances but with no c/o increased symptoms    Time 4   Period Weeks   Status Partially Met   PT LONG TERM GOAL #3   Title Pt will sit with  lumbar support in place x 2 hours and be able to stand from her desk with < 3/10 pain in her hips.    Baseline pt able to sit for 2-3 hours and stand without pain   Time 4   Period Weeks   Status Achieved               Plan - 11/04/14 1749    Clinical Impression Statement Pt progressed with PT well. Pt denies any low back pain or hip pain. Pt is able to consistently activate her TrA and hold a contraction with various tasks. Pt is able to consistently hold pelvic neutral with activities including quadraped. Pt progressed with stretching and R hip mobilization to allow more pain free range of motion. At this time, pt is discharged from skilled PT to independence HEP.    Pt will benefit from skilled therapeutic intervention in order to improve on the following deficits Abnormal gait;Decreased activity tolerance;Decreased  mobility;Decreased strength;Hypomobility;Difficulty walking;Decreased range of motion   Rehab Potential Good   PT Frequency 2x / week   PT Duration 4 weeks   PT Treatment/Interventions ADLs/Self Care Home Management;Moist Heat;Cryotherapy;Stair training;Gait training;Neuromuscular re-education;Patient/family education;Therapeutic activities;Therapeutic exercise;Functional mobility training;Balance training;Manual techniques   PT Home Exercise Plan Pt educated on importance of keeping her R hip moving and provided with ways to progress her core stability at home.    Recommended Other Services continue to stay active    Consulted and Agree with Plan of Care Patient        Problem List Patient Active Problem List   Diagnosis Date Noted  . Elevated hemoglobin A1c 10/01/2014  . Urinary frequency 10/01/2014  . Low TSH level 10/01/2014  . Right hip pain 10/01/2014  . Lumbago due to displacement of intervertebral disc 10/01/2014  . Shift work sleep disorder 10/01/2014  . Cystocele 10/01/2014  . Absolute anemia 09/11/2014  . Bradycardia 09/11/2014  . Compression fracture 09/11/2014  . Fracture of lumbar spine 09/11/2014  . Cold sore 09/11/2014  . Cannot sleep 09/11/2014  . Personal history of urinary calculi 09/11/2014  . Asymptomatic postmenopausal status 09/11/2014    Lavone Neri, SPT  11/05/2014, 7:44 AM  [place] [facility] [address]. [place], [place], [address] Phone: [phone]   Fax:  [phone]

## 2014-11-05 ENCOUNTER — Encounter: Payer: 59 | Admitting: Physical Therapy

## 2014-12-02 ENCOUNTER — Ambulatory Visit: Payer: 59 | Attending: Physician Assistant | Admitting: Physical Therapy

## 2014-12-02 DIAGNOSIS — M5441 Lumbago with sciatica, right side: Secondary | ICD-10-CM | POA: Insufficient documentation

## 2014-12-02 DIAGNOSIS — M25551 Pain in right hip: Secondary | ICD-10-CM | POA: Insufficient documentation

## 2014-12-07 ENCOUNTER — Encounter: Payer: Self-pay | Admitting: Physical Therapy

## 2014-12-07 ENCOUNTER — Ambulatory Visit: Payer: 59 | Admitting: Physical Therapy

## 2014-12-07 DIAGNOSIS — M5441 Lumbago with sciatica, right side: Secondary | ICD-10-CM | POA: Diagnosis present

## 2014-12-07 DIAGNOSIS — M25551 Pain in right hip: Secondary | ICD-10-CM

## 2014-12-07 NOTE — Therapy (Signed)
[facility] [address] [place], [place], [address] Phone: [phone]   Fax:  [phone]  Physical Therapy Treatment  Patient Details  Name: Sara Figueroa MRN: [id number] Date of Birth: Jul 11, 1960 Referring Provider:  Mar Daring, Mamie Nick*  Encounter Date: 12/07/2014      PT End of Session - 12/07/14 1544    Visit Number 9   Number of Visits 13   Date for PT Re-Evaluation 01/04/15   PT Start Time 6861   PT Stop Time 1340   PT Time Calculation (min) 45 min   Activity Tolerance Patient tolerated treatment well;No increased pain   Behavior During Therapy [facility] for tasks assessed/performed      Past Medical History  Diagnosis Date  . Allergy     seasonal    Past Surgical History  Procedure Laterality Date  . Refractive surgery  2008    Bil  . Cyst on eyebrow      right    There were no vitals filed for this visit.  Visit Diagnosis:  Hip pain, right  Right-sided low back pain with right-sided sciatica      Subjective Assessment - 12/07/14 1542    Subjective Pt reports no pain but hobbling on her walk up to PT tx session. Pt reports stiffness over the weekend and that she over did it with steps and cleaning out her beach house.    Limitations Lifting;Standing;Walking   Patient Stated Goals decrease back pain/hip pain/increase core strength    Currently in Pain? No/denies      OBJECTIVE: Manual: B LE/lumbar stretching (all planes with focus on piriformis/ rotn.). Grade III hip mobilization AP and PA 5 x 30 seconds, 2 sets. There ex: Plank on knees and elbows/plank on toes and arms with dumbells for wrist support 3 x 30 seconds each position. Reviewed at length HEP (made modifications reduced basic TrA contraction and incorporated more advanced TrA contraction: bicycle, bridging, planks and quadraped).  Pt response to Tx for medical necessity: No increased pain noted and pelvic neutral and good TrA contraction  throughout. Pt reports hip stiffness decreasing with PT tx and walking improved post mobilizations.         PT Education - 12/07/14 1543    Education provided Yes   Education Details Pt educated on what exercises to discontinue and how to progress when appropriate. Pt now doing supine bridging, supine bicycle, planks on hands and feet, alteranting arm and leg and stretching.    Person(s) Educated Patient   Methods Explanation;Demonstration;Handout   Comprehension Verbalized understanding;Returned demonstration             PT Long Term Goals - 12/07/14 1551    PT LONG TERM GOAL #1   Title Pt will improve glut strength to 4/5 to improve hip and lumabr stability with running.    Baseline 3/5 on eval; 8/24 4/5   Time 4   Period Weeks   Status Achieved   PT LONG TERM GOAL #2   Title Pt will walk x 3 miles without increase of symptoms to return to her PLOF.    Baseline pt is currently walking shorter distances but with no c/o increased symptoms    Time 4   Period Weeks   Status Partially Met   PT LONG TERM GOAL #3   Title Pt will sit with lumbar support in place x 2 hours and be able to stand from her desk with < 3/10 pain in  her hips.    Baseline pt able to sit for 2-3 hours and stand without pain   Time 4   Period Weeks   Status Achieved   PT LONG TERM GOAL #4   Title Pt will report no R hip buckling  in order to return to [place] track.    Time 4   Period Weeks   Status New   PT LONG TERM GOAL #5   Title Pt will report decreased tenderness and stiffness with R hip grade III mobilizations in order to climb stairs without increased complaints of pain.    Time 4   Period Weeks   Status New               Plan - 12/07/14 1545    Clinical Impression Statement Pt. R hip stiffness has significant capsular AP/PA hypomobility during joint assessment.  Pts. strength remains 5/5 MMT in B LE except glut med 4+/5 MMT.  Pt ambulates with decreased hip hike and neutral pelvis  alignment after joint mobilizations. Pt able to maintain plank for 30 seconds on hands and toes so encouraged to plank at home and  begin side planks.  Pt. will benefit from short-term PT to focus on hip mobility/ core strengthening with HEP progression.     Pt will benefit from skilled therapeutic intervention in order to improve on the following deficits Abnormal gait;Decreased activity tolerance;Decreased mobility;Decreased strength;Hypomobility;Difficulty walking;Decreased range of motion   Rehab Potential Good   PT Frequency 1x / week   PT Duration 4 weeks   PT Treatment/Interventions ADLs/Self Care Home Management;Moist Heat;Cryotherapy;Stair training;Gait training;Neuromuscular re-education;Patient/family education;Therapeutic activities;Therapeutic exercise;Functional mobility training;Balance training;Manual techniques   PT Home Exercise Plan Pt educated on importance of keeping her R hip moving and provided with ways to progress her core stability at home.    Consulted and Agree with Plan of Care Patient        Problem List Patient Active Problem List   Diagnosis Date Noted  . Elevated hemoglobin A1c 10/01/2014  . Urinary frequency 10/01/2014  . Low TSH level 10/01/2014  . Right hip pain 10/01/2014  . Lumbago due to displacement of intervertebral disc 10/01/2014  . Shift work sleep disorder 10/01/2014  . Cystocele 10/01/2014  . Absolute anemia 09/11/2014  . Bradycardia 09/11/2014  . Compression fracture 09/11/2014  . Fracture of lumbar spine 09/11/2014  . Cold sore 09/11/2014  . Cannot sleep 09/11/2014  . Personal history of urinary calculi 09/11/2014  . Asymptomatic postmenopausal status 09/11/2014   Pura Spice, PT, DPT # [id number]   12/08/2014, 5:05 PM  [facility] [address] [place], [place], [address] Phone: [phone]   Fax:  [phone]

## 2014-12-14 ENCOUNTER — Ambulatory Visit: Payer: 59 | Attending: Physician Assistant | Admitting: Physical Therapy

## 2014-12-14 DIAGNOSIS — M25551 Pain in right hip: Secondary | ICD-10-CM | POA: Insufficient documentation

## 2014-12-14 DIAGNOSIS — M5441 Lumbago with sciatica, right side: Secondary | ICD-10-CM | POA: Diagnosis present

## 2014-12-15 ENCOUNTER — Encounter: Payer: Self-pay | Admitting: Physical Therapy

## 2014-12-15 NOTE — Therapy (Signed)
[facility] [address] [place], [place], [address] Phone: [phone]   Fax:  [phone]  Physical Therapy Treatment  Patient Details  Name: Sara Figueroa MRN: [id number] Date of Birth: [birth year] Referring Provider:  Mar Daring, Mamie Nick*  Encounter Date: 12/14/2014      PT End of Session - 12/15/14 1045    Visit Number 10   Number of Visits 13   Date for PT Re-Evaluation 01/04/15   PT Start Time 7342   PT Stop Time 8768   PT Time Calculation (min) 48 min   Activity Tolerance Patient tolerated treatment well;No increased pain   Behavior During Therapy [facility] for tasks assessed/performed      Past Medical History  Diagnosis Date  . Allergy     seasonal    Past Surgical History  Procedure Laterality Date  . Refractive surgery  2008    Bil  . Cyst on eyebrow      right    There were no vitals filed for this visit.  Visit Diagnosis:  Hip pain, right  Right-sided low back pain with right-sided sciatica      Subjective Assessment - 12/15/14 1044    Subjective Pt reports R hip feeling well even after a drive to [place] and back over the weeekend. Pt states that she had some mild stiffness from being in the car and feels a little tight today.    Limitations Lifting;Standing;Walking   Patient Stated Goals decrease back pain/hip pain/increase core strength    Currently in Pain? No/denies       OBJECTIVE: Manual: B LE/lumbar stretching (all planes with focus on piriformis/R hip abduction). Grade III hip mobilization AP and PA 5 x 30 seconds, 2 sets. There ex: Plank on hands and toes 30 seconds x 2, 40 seconds once. Attempted side planks but painful to pt's B shoulders so encouraged pt to increase time with front planks instead. Seated green therapy ball exercises: alternating arm and leg 20 x 2/marching x 15/straight leg raise 15 x 2.  Pt response to Tx for medical necessity: Increased tenderness noted  with R hip mobilizations and R hip IT band palpation. Pt continues to progress with strengthening and is complaint with HEP without complaints of increased pain.       PT Long Term Goals - 12/07/14 1551    PT LONG TERM GOAL #1   Title Pt will improve glut strength to 4/5 to improve hip and lumabr stability with running.    Baseline 3/5 on eval; 8/24 4/5   Time 4   Period Weeks   Status Achieved   PT LONG TERM GOAL #2   Title Pt will walk x 3 miles without increase of symptoms to return to her PLOF.    Baseline pt is currently walking shorter distances but with no c/o increased symptoms    Time 4   Period Weeks   Status Partially Met   PT LONG TERM GOAL #3   Title Pt will sit with lumbar support in place x 2 hours and be able to stand from her desk with < 3/10 pain in her hips.    Baseline pt able to sit for 2-3 hours and stand without pain   Time 4   Period Weeks   Status Achieved   PT LONG TERM GOAL #4   Title Pt will report no R hip buckling  in order to return to [place] track.    Time 4  Period Weeks   Status New   PT LONG TERM GOAL #5   Title Pt will report decreased tenderness and stiffness with R hip grade III mobilizations in order to climb stairs without increased complaints of pain.    Time 4   Period Weeks   Status New               Plan - 12/15/14 1045    Clinical Impression Statement Pt R hip AP stiffness remains tight with a grade III mobilization. Pt's flexibility is maintaining a good stretch with limited R hip abduction stretch due to R hip joint. Pt is able to hold a plank on hands and feet for 40 seconds and encouraged to increase her plank time at home. Pt IT band on R side is tender to palpation. Pt educated on how to stretch and roll IT band out with a tennis ball.    Pt will benefit from skilled therapeutic intervention in order to improve on the following deficits Abnormal gait;Decreased activity tolerance;Decreased mobility;Decreased  strength;Hypomobility;Difficulty walking;Decreased range of motion   Rehab Potential Good   PT Frequency 1x / week   PT Duration 4 weeks   PT Treatment/Interventions ADLs/Self Care Home Management;Moist Heat;Cryotherapy;Stair training;Gait training;Neuromuscular re-education;Patient/family education;Therapeutic activities;Therapeutic exercise;Functional mobility training;Balance training;Manual techniques   PT Home Exercise Plan Pt educated on importance of keeping her R hip moving and provided with ways to progress her core stability at home.    Consulted and Agree with Plan of Care Patient        Problem List Patient Active Problem List   Diagnosis Date Noted  . Elevated hemoglobin A1c 10/01/2014  . Urinary frequency 10/01/2014  . Low TSH level 10/01/2014  . Right hip pain 10/01/2014  . Lumbago due to displacement of intervertebral disc 10/01/2014  . Shift work sleep disorder 10/01/2014  . Cystocele 10/01/2014  . Absolute anemia 09/11/2014  . Bradycardia 09/11/2014  . Compression fracture 09/11/2014  . Fracture of lumbar spine ([place]) 09/11/2014  . Cold sore 09/11/2014  . Cannot sleep 09/11/2014  . Personal history of urinary calculi 09/11/2014  . Asymptomatic postmenopausal status 09/11/2014    Lavone Neri, SPT 12/15/2014, 10:49 AM  [place] [facility] [address] [place], [place], [address] Phone: [phone]   Fax:  [phone]

## 2014-12-21 ENCOUNTER — Ambulatory Visit: Payer: 59 | Admitting: Physical Therapy

## 2014-12-21 ENCOUNTER — Encounter: Payer: Self-pay | Admitting: Physical Therapy

## 2014-12-21 DIAGNOSIS — M25551 Pain in right hip: Secondary | ICD-10-CM | POA: Diagnosis not present

## 2014-12-21 DIAGNOSIS — M5441 Lumbago with sciatica, right side: Secondary | ICD-10-CM
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
## 2014-12-25 ENCOUNTER — Encounter: Payer: Self-pay | Admitting: Physician Assistant

## 2014-12-28 ENCOUNTER — Encounter: Payer: 59 | Admitting: Physical Therapy

## 2014-12-29 ENCOUNTER — Ambulatory Visit: Payer: 59 | Admitting: Physical Therapy

## 2014-12-29 DIAGNOSIS — M25551 Pain in right hip: Secondary | ICD-10-CM | POA: Diagnosis not present

## 2014-12-29 DIAGNOSIS — M5441 Lumbago with sciatica, right side: Secondary | ICD-10-CM

## 2014-12-30 ENCOUNTER — Encounter: Payer: Self-pay | Admitting: Physical Therapy

## 2014-12-30 NOTE — Therapy (Signed)
[facility] [address] [place], [place], [address] Phone: [phone]   Fax:  [phone]  Physical Therapy Treatment  Patient Details  Name: SARALYNN LANGHORST MRN: [id number] Date of Birth: January 28, 1961 No Data Recorded  Encounter Date: 12/29/2014      PT End of Session - 12/30/14 1628    Visit Number 12   Number of Visits 13   Date for PT Re-Evaluation 01/04/15   PT Start Time 1426   PT Stop Time 1519   PT Time Calculation (min) 53 min   Activity Tolerance Patient tolerated treatment well;No increased pain   Behavior During Therapy [facility] for tasks assessed/performed      Past Medical History  Diagnosis Date  . Allergy     seasonal    Past Surgical History  Procedure Laterality Date  . Refractive surgery  2008    Bil  . Cyst on eyebrow      right    There were no vitals filed for this visit.  Visit Diagnosis:  Hip pain, right  Right-sided low back pain with right-sided sciatica      Subjective Assessment - 12/30/14 1621    Subjective Pt. reports she feels her office chair is causing some of her hip pain.  Pt. had a nice weekend but pain in hip returned with sitting at work today.     Limitations Lifting;Standing;Walking   Patient Stated Goals decrease back pain/hip pain/increase core strength    Currently in Pain? No/denies      OBJECTIVE: Manual: B LE/lumbar stretching (all planes with focus on piriformis/R hip abduction). Grade III hip mobilization AP and PA 5 x 30 seconds, 2 sets. Supine R hip AP mobs./ no MWM today. There ex: Seated green therapy ball exercises: alternating arm and leg 20 x 2/marching x 15/straight leg raise 15 x 2. Discussed ball ex. For HEP.  Pt response to Tx for medical necessity: Increased tenderness noted with R hip mobilizations and R hip IT band palpation. Pt continues to progress with strengthening and is complaint with HEP without complaints of increased  pain.        PT Long Term Goals - 12/07/14 1551    PT LONG TERM GOAL #1   Title Pt will improve glut strength to 4/5 to improve hip and lumabr stability with running.    Baseline 3/5 on eval; 8/24 4/5   Time 4   Period Weeks   Status Achieved   PT LONG TERM GOAL #2   Title Pt will walk x 3 miles without increase of symptoms to return to her PLOF.    Baseline pt is currently walking shorter distances but with no c/o increased symptoms    Time 4   Period Weeks   Status Partially Met   PT LONG TERM GOAL #3   Title Pt will sit with lumbar support in place x 2 hours and be able to stand from her desk with < 3/10 pain in her hips.    Baseline pt able to sit for 2-3 hours and stand without pain   Time 4   Period Weeks   Status Achieved   PT LONG TERM GOAL #4   Title Pt will report no R hip buckling  in order to return to [place] track.    Time 4   Period Weeks   Status New   PT LONG TERM GOAL #5   Title Pt will report decreased tenderness and stiffness with R  hip grade III mobilizations in order to climb stairs without increased complaints of pain.    Time 4   Period Weeks   Status New               Plan - 12/30/14 1629    Clinical Impression Statement R hip capsule stiffness remains present, esp. with attempts at piriformis/ posterior capsule stretching.  Improved gait pattern after stretching but slight R hip antalgic gait pattern noted.  PT planning on assessing ergonomic set up of office chair/ space to improve hip pain/ stiffness.    Pt will benefit from skilled therapeutic intervention in order to improve on the following deficits Abnormal gait;Decreased activity tolerance;Decreased mobility;Decreased strength;Hypomobility;Difficulty walking;Decreased range of motion   Rehab Potential Good   PT Frequency 1x / week   PT Duration 4 weeks   PT Treatment/Interventions ADLs/Self Care Home Management;Moist Heat;Cryotherapy;Stair training;Gait training;Neuromuscular  re-education;Patient/family education;Therapeutic activities;Therapeutic exercise;Functional mobility training;Balance training;Manual techniques   PT Next Visit Plan Issue more progressive ball/ core stability ex. program next visit.     PT Home Exercise Plan Pt educated on importance of keeping her R hip moving and provided with ways to progress her core stability at home.    Consulted and Agree with Plan of Care Patient        Problem List Patient Active Problem List   Diagnosis Date Noted  . Elevated hemoglobin A1c 10/01/2014  . Urinary frequency 10/01/2014  . Low TSH level 10/01/2014  . Right hip pain 10/01/2014  . Lumbago due to displacement of intervertebral disc 10/01/2014  . Shift work sleep disorder 10/01/2014  . Cystocele 10/01/2014  . Absolute anemia 09/11/2014  . Bradycardia 09/11/2014  . Compression fracture 09/11/2014  . Fracture of lumbar spine ([place]) 09/11/2014  . Cold sore 09/11/2014  . Cannot sleep 09/11/2014  . Personal history of urinary calculi 09/11/2014  . Asymptomatic postmenopausal status 09/11/2014   Pura Spice, PT, DPT # [id number]   12/30/2014, 4:33 PM  [facility] [address] [place], [place], [address] Phone: [phone]   Fax:  [phone]  Name: GLORIANN RIEDE MRN: [id number] Date of Birth: 1960/05/11

## 2015-01-04 ENCOUNTER — Ambulatory Visit: Payer: 59 | Admitting: Physical Therapy

## 2015-01-04 ENCOUNTER — Encounter: Payer: Self-pay | Admitting: Physical Therapy

## 2015-01-04 DIAGNOSIS — M25551 Pain in right hip: Secondary | ICD-10-CM

## 2015-01-04 DIAGNOSIS — M5441 Lumbago with sciatica, right side: Secondary | ICD-10-CM

## 2015-01-04 NOTE — Therapy (Signed)
[place] [facility] [address] [place], [place], [address] Phone: [phone]   Fax:  [phone]  Physical Therapy Treatment  Patient Details  Name: Sara Figueroa MRN: [id number] Date of Birth: 03/19/1960 No Data Recorded  Encounter Date: 01/04/2015      PT End of Session - 01/04/15 1745    Visit Number 13   Number of Visits 13   Date for PT Re-Evaluation 01/04/15   PT Start Time 1937   PT Stop Time 1330   PT Time Calculation (min) 31 min   Activity Tolerance Patient tolerated treatment well;No increased pain   Behavior During Therapy [facility] for tasks assessed/performed      Past Medical History  Diagnosis Date  . Allergy     seasonal    Past Surgical History  Procedure Laterality Date  . Refractive surgery  2008    Bil  . Cyst on eyebrow      right    There were no vitals filed for this visit.  Visit Diagnosis:  Hip pain, right  Right-sided low back pain with right-sided sciatica      Subjective Assessment - 01/04/15 1744    Subjective Pt reports one R hip buckling episode earlier today. Pt reports overall feeling better and like her R hip is less stiff than when she initially came to PT.   Limitations Lifting;Standing;Walking   Patient Stated Goals decrease back pain/hip pain/increase core strength    Currently in Pain? No/denies       OBJECTIVE: Manual: Lateral hip mobilizations with mobilization belt, grade III 6 x 30 seconds. Stretching to R hip musculature/piriformis. There ex: On green therapy: seated straight leg raise/alternating arms and legs x 40 each. Gait to check form and R hip motion with ambulation.   Pt response to Tx for medical necessity:  Pt progressed to independence with core stability and hip stretching/strengthening. Pt educated on importance of maintaining current hip motion and an active lifestyle. Pt d/c from PT at this time.        PT Education - 01/04/15 1745    Education provided  Yes   Education Details Pt reeducated on HEP and performed seated core exercises on the green therapy ball to ensure proper technique with transition to home based exercise.    Person(s) Educated Patient   Methods Explanation;Demonstration   Comprehension Returned demonstration;Verbalized understanding             PT Long Term Goals - 01/04/15 1748    PT LONG TERM GOAL #1   Title Pt will improve glut strength to 4/5 to improve hip and lumabr stability with running.    Baseline 3/5 on eval; 8/24 4/5   Time 4   Period Weeks   Status Achieved   PT LONG TERM GOAL #2   Title Pt will walk x 3 miles without increase of symptoms to return to her PLOF.    Baseline pt is currently walking shorter distances but with no c/o increased symptoms    Time 4   Period Weeks   Status Not Met   PT LONG TERM GOAL #3   Title Pt will sit with lumbar support in place x 2 hours and be able to stand from her desk with < 3/10 pain in her hips.    Baseline pt able to sit for 2-3 hours and stand without pain   Time 4   Period Weeks   Status Achieved   PT LONG TERM  GOAL #4   Title Pt will report no R hip buckling  in order to return to [place] track.    Time 4   Period Weeks   Status Partially Met   PT LONG TERM GOAL #5   Title Pt will report decreased tenderness and stiffness with R hip grade III mobilizations in order to climb stairs without increased complaints of pain.    Time 4   Period Weeks   Status Achieved               Plan - 01/04/15 1746    Clinical Impression Statement Pt has stiffness with R hip capsule, noted posteriorly, but patient is progressing with HEP to maintain current level of motion. Pt's flexibility on R is improved but not equal to L side. Pt demonstrates independence with stretching and core stability exercies. At this time, pt has no needs for skilled PT and is discharged home to HEP. Pt educated to call PT if symptoms are aggrevated or feels as if she is regressing.     Pt will benefit from skilled therapeutic intervention in order to improve on the following deficits Abnormal gait;Decreased activity tolerance;Decreased mobility;Decreased strength;Hypomobility;Difficulty walking;Decreased range of motion   Rehab Potential Good   PT Frequency 1x / week   PT Duration 4 weeks   PT Treatment/Interventions ADLs/Self Care Home Management;Moist Heat;Cryotherapy;Stair training;Gait training;Neuromuscular re-education;Patient/family education;Therapeutic activities;Therapeutic exercise;Functional mobility training;Balance training;Manual techniques   PT Home Exercise Plan Pt educated on importance of keeping her R hip moving and provided with ways to progress her core stability at home.    Consulted and Agree with Plan of Care Patient        Problem List Patient Active Problem List   Diagnosis Date Noted  . Elevated hemoglobin A1c 10/01/2014  . Urinary frequency 10/01/2014  . Low TSH level 10/01/2014  . Right hip pain 10/01/2014  . Lumbago due to displacement of intervertebral disc 10/01/2014  . Shift work sleep disorder 10/01/2014  . Cystocele 10/01/2014  . Absolute anemia 09/11/2014  . Bradycardia 09/11/2014  . Compression fracture 09/11/2014  . Fracture of lumbar spine ([place]) 09/11/2014  . Cold sore 09/11/2014  . Cannot sleep 09/11/2014  . Personal history of urinary calculi 09/11/2014  . Asymptomatic postmenopausal status 09/11/2014    Sara Figueroa, SPT 01/04/2015, 5:48 PM  [place] [address] [place], [place], [address] Phone: [phone]   Fax:  [phone]  Name: Sara Figueroa MRN: [id number] Date of Birth: 03/31/1960

## 2015-03-10 ENCOUNTER — Encounter: Payer: Self-pay | Admitting: Physician Assistant

## 2015-03-10 ENCOUNTER — Ambulatory Visit: Payer: Self-pay | Admitting: Physician Assistant

## 2015-03-10 VITALS — BP 145/80 | HR 73 | Temp 98.0°F

## 2015-03-10 DIAGNOSIS — N390 Urinary tract infection, site not specified: Secondary | ICD-10-CM

## 2015-03-10 LAB — POCT URINALYSIS DIPSTICK
BILIRUBIN UA: NEGATIVE
Blood, UA: NEGATIVE
GLUCOSE UA: NEGATIVE
Ketones, UA: NEGATIVE
Nitrite, UA: NEGATIVE
PROTEIN UA: NEGATIVE
Urobilinogen, UA: 0.2
pH, UA: 5.5

## 2015-03-10 MED ORDER — CIPROFLOXACIN HCL 250 MG PO TABS: 250.0000 mg | ORAL_TABLET | Freq: Two times a day (BID) | ORAL | Status: DC

## 2015-03-10 NOTE — Progress Notes (Signed)
S:  C/o uti sx for 2 days, urgency, frequency, low back pain, denies vaginal discharge, abdominal pain or flank pain:  Remainder ros neg  O:  Vitals wnl, nad, no cva tenderness, back nontender, ua trace leuks A: uti  P: cipro 250mg  bid x 7d, increase water intake, add cranberry juice, return if not improving in 2 -3 days, return earlier if worsening, discussed pyelonephritis sx, recheck after done with antibiotic, if still has leuks then go to gyn

## 2015-03-17 ENCOUNTER — Encounter: Payer: Self-pay | Admitting: Physician Assistant

## 2015-03-17 ENCOUNTER — Ambulatory Visit: Payer: Self-pay | Admitting: Physician Assistant

## 2015-03-17 VITALS — BP 160/100 | HR 60 | Temp 98.3°F

## 2015-03-17 DIAGNOSIS — R030 Elevated blood-pressure reading, without diagnosis of hypertension: Secondary | ICD-10-CM

## 2015-03-17 DIAGNOSIS — R300 Dysuria: Secondary | ICD-10-CM

## 2015-03-17 LAB — POCT URINALYSIS DIPSTICK
Bilirubin, UA: NEGATIVE
Glucose, UA: NEGATIVE
KETONES UA: NEGATIVE
Leukocytes, UA: NEGATIVE
Nitrite, UA: NEGATIVE
Protein, UA: NEGATIVE
RBC UA: NEGATIVE
SPEC GRAV UA: 1.015
Urobilinogen, UA: 0.2
pH, UA: 6

## 2015-03-17 NOTE — Progress Notes (Signed)
S: here for recheck of urine, no urinary problems at this time, finished cipro;   O; vitals wnl except for elevated bp, lungs c t a, cv rrr, ua wnl  A: resolved uti, elevated bp without dx of htn  P: recheck bp this week, f/u with pcp asap, return if bp worsening

## 2015-04-08 ENCOUNTER — Encounter: Payer: Self-pay | Admitting: Physician Assistant

## 2015-04-08 ENCOUNTER — Ambulatory Visit (INDEPENDENT_AMBULATORY_CARE_PROVIDER_SITE_OTHER): Payer: 59 | Admitting: Physician Assistant

## 2015-04-08 VITALS — BP 140/80 | HR 58 | Temp 98.1°F | Resp 16 | Wt 181.4 lb

## 2015-04-08 DIAGNOSIS — B001 Herpesviral vesicular dermatitis: Secondary | ICD-10-CM | POA: Diagnosis not present

## 2015-04-08 DIAGNOSIS — G4726 Circadian rhythm sleep disorder, shift work type: Secondary | ICD-10-CM | POA: Diagnosis not present

## 2015-04-08 DIAGNOSIS — E7800 Pure hypercholesterolemia, unspecified: Secondary | ICD-10-CM | POA: Diagnosis not present

## 2015-04-08 MED ORDER — DOXEPIN HCL 3 MG PO TABS: 1.0000 | ORAL_TABLET | Freq: Every evening | ORAL | Status: DC | PRN

## 2015-04-08 MED ORDER — VALACYCLOVIR HCL 1 G PO TABS: 1000.0000 mg | ORAL_TABLET | Freq: Two times a day (BID) | ORAL | Status: DC

## 2015-04-08 NOTE — Patient Instructions (Signed)

## 2015-04-08 NOTE — Progress Notes (Signed)
Patient: Sara Figueroa Female    DOB: [birth year]   55 y.o.   MRN: [id number] Visit Date: 04/08/2015  Today's Provider: Mar Daring, PA-C   Chief Complaint  Patient presents with  . Follow-up    Cholesterol   Subjective:    HPI  Lipid/Cholesterol, Follow-up:   Last seen for this6 months ago.  Management changes since that visit include diet. . Last Lipid Panel:    Component Value Date/Time   CHOL 225* 10/02/2014 0818   TRIG 70 10/02/2014 0818   HDL 87 10/02/2014 0818   CHOLHDL 2.6 10/02/2014 0818   VLDL 14 10/02/2014 0818   LDLCALC 124* 10/02/2014 0818    She reports good compliance with treatment. Weight trend: fluctuating a bit Prior visit with dietician: no Current diet: in general, a "healthy" diet   Current exercise: cardiovascular workout on exercise equipment and Joga.  Wt Readings from Last 3 Encounters:  04/08/15 181 lb 6.4 oz (82.283 kg)  10/01/14 185 lb 12.8 oz (84.278 kg)  07/18/13 173 lb (78.472 kg)    -------------------------------------------------------------------     No Known Allergies Previous Medications   CALCIUM CARB-CHOLECALCIFEROL (CALCIUM PLUS VITAMIN D3) 600-800 MG-UNIT TABS    Take 1 tablet by mouth daily.   CYCLOBENZAPRINE (FLEXERIL) 10 MG TABLET    Take 0.5-1 tablets by mouth at bedtime. Reported on 04/08/2015   DOXEPIN HCL 3 MG TABS    Take 1 tablet (3 mg total) by mouth at bedtime as needed.   IBUPROFEN (ADVIL,MOTRIN) 200 MG TABLET    Take 200 mg by mouth 3 (three) times daily.   LORATADINE (CLARITIN) 10 MG TABLET    Take 10 mg by mouth daily.   MULTIPLE VITAMIN (MULTIVITAMIN) TABLET    Take 1 tablet by mouth daily.   OMEGA-3 FATTY ACIDS (FISH OIL) 1000 MG CAPS    Take 1 capsule by mouth daily.   OMEPRAZOLE (PRILOSEC) 20 MG CAPSULE    Take 20 mg by mouth as needed.   VALACYCLOVIR (VALTREX) 1000 MG TABLET    Take 1 tablet (1,000 mg total) by mouth 2 (two) times daily.    Review of Systems  Constitutional:  Negative.   HENT: Negative.   Respiratory: Negative.   Cardiovascular: Negative.   Gastrointestinal: Negative.   Genitourinary: Negative.   Musculoskeletal: Positive for back pain.  Neurological: Negative.     Social History  Substance Use Topics  . Smoking status: Former Smoker -- 0.50 packs/day for 20 years    Types: Cigarettes  . Smokeless tobacco: Never Used     Comment: quit in 2007  . Alcohol Use: Yes     Comment: DRINKS 1 GLASS OF WINE EVERY OTHER NIGHT   Objective:   BP 140/80 mmHg  Pulse 58  Temp(Src) 98.1 F (36.7 C) (Oral)  Resp 16  Wt 181 lb 6.4 oz (82.283 kg)  Physical Exam  Constitutional: She appears well-developed and well-nourished. No distress.  Neck: Normal range of motion. Neck supple. No JVD present. No tracheal deviation present. No thyromegaly present.  Cardiovascular: Normal rate, regular rhythm and normal heart sounds.  Exam reveals no gallop and no friction rub.   No murmur heard. Pulmonary/Chest: Effort normal and breath sounds normal. No respiratory distress. She has no wheezes. She has no rales.  Lymphadenopathy:    She has no cervical adenopathy.  Skin: She is not diaphoretic.  Vitals reviewed.       Assessment & Plan:  1. Hypercholesterolemia She has been trying to make healthier choices with her diet and has just recently started exercise program at work. She is working out on the elliptical at her office at this time. She is starting slow and working on increasing her time. She states that she is up to 14 minutes on the elliptical. This has helped her low back pain. She has also lost approximately 6 pounds since previously seen. I will recheck her cholesterol below. I will follow-up with her pending these results. I will see her back in 6 months for her complete physical exam. She is to call the office if she has any acute issues, questions or concerns. - Lipid panel  2. Herpes labialis Has flares with stress and cold weather. She  states that since the holidays she has had to increase her use due to more frequent flares. Medication was pulled as below for refills. - valACYclovir (VALTREX) 1000 MG tablet; Take 1 tablet (1,000 mg total) by mouth 2 (two) times daily.  Dispense: 60 tablet; Refill: 4  3. Shift work sleep disorder Does well with doxepin 3 mg. She states she uses this approximately 5 nights a week. I will refill as below. She is to call the office if symptoms worsen. I will see her back in 6 months for her complete physical exam. - Doxepin HCl 3 MG TABS; Take 1 tablet (3 mg total) by mouth at bedtime as needed.  Dispense: 30 tablet; Refill: [place], PA-C  [facility]

## 2015-04-12 ENCOUNTER — Other Ambulatory Visit
Admission: RE | Admit: 2015-04-12 | Discharge: 2015-04-12 | Disposition: A | Discharge status: Home / Self Care | Payer: 59 | Source: Ambulatory Visit | Attending: Physician Assistant | Admitting: Physician Assistant

## 2015-04-12 DIAGNOSIS — E7800 Pure hypercholesterolemia, unspecified: Secondary | ICD-10-CM | POA: Insufficient documentation

## 2015-04-12 LAB — LIPID PANEL
CHOL/HDL RATIO: 2.5 ratio
CHOLESTEROL: 192 mg/dL (ref 0–200)
HDL: 76 mg/dL (ref >40)
LDL Cholesterol: 89 mg/dL (ref 0–99)
Triglycerides: 136 mg/dL (ref <150)
VLDL: 27 mg/dL (ref 0–40)

## 2015-07-20 ENCOUNTER — Encounter: Payer: Self-pay | Admitting: Physician Assistant

## 2015-07-20 DIAGNOSIS — T753XXA Motion sickness, initial encounter: Secondary | ICD-10-CM

## 2015-07-21 ENCOUNTER — Encounter: Payer: Self-pay | Admitting: Physician Assistant

## 2015-07-21 DIAGNOSIS — T753XXA Motion sickness, initial encounter: Secondary | ICD-10-CM

## 2015-07-21 MED ORDER — SCOPOLAMINE 1 MG/3DAYS TD PT72: 1.0000 | MEDICATED_PATCH | TRANSDERMAL | Status: DC

## 2015-08-05 ENCOUNTER — Encounter: Payer: Self-pay | Admitting: Physician Assistant

## 2015-08-05 ENCOUNTER — Ambulatory Visit: Payer: Self-pay | Admitting: Physician Assistant

## 2015-08-05 VITALS — BP 150/90 | HR 80 | Temp 98.7°F

## 2015-08-05 DIAGNOSIS — S90822A Blister (nonthermal), left foot, initial encounter: Secondary | ICD-10-CM

## 2015-08-05 NOTE — Progress Notes (Signed)
S: noticed a blister/lesion on foot, thinks something bit her ,  Was white like a pustule, now area has opened and has clear fluid, no redness, no pain, no fever/chills  O: vitals wnl, nad, skin with small blister on plantar surface with clear exudate, no redness or swelling, n/v intact  A: blister on foot  P: f/u prn, if redness pus or swelling, or increased pain, can call in antibiotic, not needed at this time

## 2015-10-06 ENCOUNTER — Emergency Department: Payer: 59

## 2015-10-06 ENCOUNTER — Encounter: Payer: Self-pay | Admitting: Emergency Medicine

## 2015-10-06 DIAGNOSIS — R000 Tachycardia, unspecified: Secondary | ICD-10-CM | POA: Diagnosis not present

## 2015-10-06 DIAGNOSIS — Z87891 Personal history of nicotine dependence: Secondary | ICD-10-CM | POA: Diagnosis not present

## 2015-10-06 DIAGNOSIS — I493 Ventricular premature depolarization: Secondary | ICD-10-CM | POA: Diagnosis not present

## 2015-10-06 DIAGNOSIS — R002 Palpitations: Secondary | ICD-10-CM | POA: Insufficient documentation

## 2015-10-06 LAB — CBC
HEMATOCRIT: 40.6 % (ref 35.0–47.0)
Hemoglobin: 13.4 g/dL (ref 12.0–16.0)
MCH: 30.6 pg (ref 26.0–34.0)
MCHC: 33.1 g/dL (ref 32.0–36.0)
MCV: 92.5 fL (ref 80.0–100.0)
PLATELETS: 289 10*3/uL (ref 150–440)
RBC: 4.39 MIL/uL (ref 3.80–5.20)
RDW: 13.7 % (ref 11.5–14.5)
WBC: 9.5 10*3/uL (ref 3.6–11.0)

## 2015-10-06 LAB — BASIC METABOLIC PANEL
Anion gap: 9 (ref 5–15)
BUN: 17 mg/dL (ref 6–20)
CHLORIDE: 107 mmol/L (ref 101–111)
CO2: 24 mmol/L (ref 22–32)
CREATININE: 0.86 mg/dL (ref 0.44–1.00)
Calcium: 9.6 mg/dL (ref 8.9–10.3)
GFR calc Af Amer: >60 mL/min (ref >60)
GFR calc non Af Amer: >60 mL/min (ref >60)
GLUCOSE: 158 mg/dL — AB (ref 65–99)
POTASSIUM: 3.6 mmol/L (ref 3.5–5.1)
Sodium: 140 mmol/L (ref 135–145)

## 2015-10-06 LAB — TROPONIN I: Troponin I: <0.03 ng/mL (ref <0.03)

## 2015-10-06 NOTE — ED Triage Notes (Signed)
Pt states when she went to bed tonight she felt her heart start racing. Pt states this morning she had felt pressure in chest that lasted for 3 to 4 minutes but went away. Pt denies history of irregular heart beat in past.

## 2015-10-07 ENCOUNTER — Encounter: Payer: 59 | Admitting: Physician Assistant

## 2015-10-07 ENCOUNTER — Emergency Department
Admission: EM | Admit: 2015-10-07 | Discharge: 2015-10-07 | Disposition: A | Discharge status: Home / Self Care | Payer: 59 | Attending: Emergency Medicine | Admitting: Emergency Medicine

## 2015-10-07 DIAGNOSIS — Z87891 Personal history of nicotine dependence: Secondary | ICD-10-CM | POA: Diagnosis not present

## 2015-10-07 DIAGNOSIS — R002 Palpitations: Secondary | ICD-10-CM

## 2015-10-07 DIAGNOSIS — I493 Ventricular premature depolarization: Secondary | ICD-10-CM

## 2015-10-07 HISTORY — DX: Unspecified osteoarthritis, unspecified site: M19.90

## 2015-10-07 LAB — TROPONIN I

## 2015-10-07 LAB — MAGNESIUM: MAGNESIUM: 2.1 mg/dL (ref 1.7–2.4)

## 2015-10-07 NOTE — ED Provider Notes (Signed)
[facility] Emergency Department Provider Note  ____________________________________________  Time seen: 1:05AM  I have reviewed the triage vital signs and the nursing notes.   HISTORY  Chief Complaint Irregular Heart Beat      HPI KENDALE KOKOSKA is a 55 y.o. female presents with palpitations with onset tonight. Patient states that in a 1 minute. She felt 8 irregular heartbeats. Patient states that they were not continuous. Patient admits to chest tightness with the events no pain at present. Patient denies shortness of breath no dizziness or diaphoresis. Patient denies any previous history of arrhythmia. Patient does state that her mother had to have an ablation done for rectal heartbeat but unsure what the arrhythmia was.    Past Medical History:  Diagnosis Date  . Allergy    seasonal  . Arthritis     Patient Active Problem List   Diagnosis Date Noted  . Elevated hemoglobin A1c 10/01/2014  . Urinary frequency 10/01/2014  . Low TSH level 10/01/2014  . Right hip pain 10/01/2014  . Lumbago due to displacement of intervertebral disc 10/01/2014  . Shift work sleep disorder 10/01/2014  . Cystocele 10/01/2014  . Absolute anemia 09/11/2014  . Bradycardia 09/11/2014  . Compression fracture 09/11/2014  . Fracture of lumbar spine ([place]) 09/11/2014  . Cold sore 09/11/2014  . Cannot sleep 09/11/2014  . Personal history of urinary calculi 09/11/2014  . Asymptomatic postmenopausal status 09/11/2014    Past Surgical History:  Procedure Laterality Date  . cyst on eyebrow     right  . REFRACTIVE SURGERY  2008   Bil     Allergies Review of patient's allergies indicates no known allergies.  Family History  Problem Relation Age of Onset  . Heart disease Mother   . Hypertension Mother   . Ulcerative colitis Mother   . Atrial fibrillation Mother   . Emphysema Father   . Parkinson's disease Father   . Skin cancer Father   . Stroke Father   .  Fibroids Sister   . Cancer Maternal Grandmother   . Cancer Maternal Grandfather   . Congestive Heart Failure Paternal Grandmother     Social History Social History  Substance Use Topics  . Smoking status: Former Smoker    Packs/day: 0.50    Years: 20.00    Types: Cigarettes  . Smokeless tobacco: Never Used     Comment: quit in 2006  . Alcohol use Yes     Comment: DRINKS 1 GLASS OF WINE EVERY OTHER NIGHT    Review of Systems  Constitutional: Negative for fever. Eyes: Negative for visual changes. ENT: Negative for sore throat. Cardiovascular: Negative for chest pain.Positive palpitations Respiratory: Negative for shortness of breath. Gastrointestinal: Negative for abdominal pain, vomiting and diarrhea. Genitourinary: Negative for dysuria. Musculoskeletal: Negative for back pain. Skin: Negative for rash. Neurological: Negative for headaches, focal weakness or numbness.   10-point ROS otherwise negative.  ____________________________________________   PHYSICAL EXAM:  VITAL SIGNS: ED Triage Vitals [10/06/15 2224]  Enc Vitals Group     BP (!) 188/89     Pulse Rate (!) 101     Resp 20     Temp 97.7 F (36.5 C)     Temp Source Oral     SpO2 100 %     Weight 175 lb (79.4 kg)     Height 5\' 5"  (1.651 m)     Head Circumference      Peak Flow      Pain Score  Pain Loc      Pain Edu?      Excl. in [place]?      Constitutional: Alert and oriented. Well appearing and in no distress. Eyes: Conjunctivae are normal. PERRL. Normal extraocular movements. ENT   Head: Normocephalic and atraumatic.   Nose: No congestion/rhinnorhea.   Mouth/Throat: Mucous membranes are moist.   Neck: No stridor. Hematological/Lymphatic/Immunilogical: No cervical lymphadenopathy. Cardiovascular: Normal rate, regular rhythm. Normal and symmetric distal pulses are present in all extremities. No murmurs, rubs, or gallops. Respiratory: Normal respiratory effort without tachypnea nor  retractions. Breath sounds are clear and equal bilaterally. No wheezes/rales/rhonchi. Gastrointestinal: Soft and nontender. No distention. There is no CVA tenderness. Genitourinary: deferred Musculoskeletal: Nontender with normal range of motion in all extremities. No joint effusions.  No lower extremity tenderness nor edema. Neurologic:  Normal speech and language. No gross focal neurologic deficits are appreciated. Speech is normal.  Skin:  Skin is warm, dry and intact. No rash noted. Psychiatric: Mood and affect are normal. Speech and behavior are normal. Patient exhibits appropriate insight and judgment.  ____________________________________________    LABS (pertinent positives/negatives)  Labs Reviewed  BASIC METABOLIC PANEL - Abnormal; Notable for the following:       Result Value   Glucose, Bld 158 (*)    All other components within normal limits  CBC  TROPONIN I  MAGNESIUM  TROPONIN I     ____________________________________________   EKG  ED ECG REPORT I, Piney N Raniya Golembeski, the attending physician, personally viewed and interpreted this ECG.   Date: 10/07/2015  EKG Time: 10:29 PM  Rate: 93  Rhythm: Normal sinus rhythm  Axis: Normal  Intervals: Normal  ST&T Change: None   ____________________________________________    RADIOLOGY CLINICAL DATA:  Perceived tachycardia.  Chest pressure. EXAM: CHEST  2 VIEW COMPARISON:  None. FINDINGS: Normal cardiac silhouette and mediastinal contours. The lungs appear mildly hyperexpanded with flattening of the diaphragms. No focal airspace opacities. No pleural effusion or pneumothorax. No evidence of edema. No acute osseus abnormalities. IMPRESSION: Mild lung hyper expansion without acute cardiopulmonary disease. Electronically Signed   By: Sandi Mariscal M.D.   On: 10/06/2015 23:00    Procedures    INITIAL IMPRESSION / ASSESSMENT AND PLAN / ED COURSE  Pertinent labs & imaging results that were available during  my care of the patient were reviewed by me and considered in my medical decision making (see chart for details).  During initial evaluation patient had multiple noncontinuous premature ventricular contractions.Cardiac enzymes negative 2 electrolytes normal remaining lab data unremarkable. We'll refer the patient to Dr.Arrida cardiologist____________________________________________   FINAL CLINICAL IMPRESSION(S) / ED DIAGNOSES  Final diagnoses:  Heart palpitations  PVC (premature ventricular contraction)      Gregor Hams, MD 10/07/15 [phone]

## 2015-10-07 NOTE — ED Notes (Signed)
Discharge instructions reviewed with patient. Questions fielded by this RN. Patient verbalizes understanding of instructions. Patient discharged home in stable condition per Brown MD . No acute distress noted at time of discharge.   

## 2015-10-14 ENCOUNTER — Ambulatory Visit (INDEPENDENT_AMBULATORY_CARE_PROVIDER_SITE_OTHER): Payer: 59 | Admitting: Physician Assistant

## 2015-10-14 ENCOUNTER — Encounter: Payer: Self-pay | Admitting: Physician Assistant

## 2015-10-14 VITALS — BP 160/98 | HR 80 | Temp 98.2°F | Ht 65.0 in | Wt 177.4 lb

## 2015-10-14 DIAGNOSIS — Z13220 Encounter for screening for lipoid disorders: Secondary | ICD-10-CM | POA: Diagnosis not present

## 2015-10-14 DIAGNOSIS — Z136 Encounter for screening for cardiovascular disorders: Secondary | ICD-10-CM | POA: Diagnosis not present

## 2015-10-14 DIAGNOSIS — R946 Abnormal results of thyroid function studies: Secondary | ICD-10-CM | POA: Diagnosis not present

## 2015-10-14 DIAGNOSIS — R7309 Other abnormal glucose: Secondary | ICD-10-CM | POA: Diagnosis not present

## 2015-10-14 DIAGNOSIS — Z23 Encounter for immunization: Secondary | ICD-10-CM | POA: Diagnosis not present

## 2015-10-14 DIAGNOSIS — Z1239 Encounter for other screening for malignant neoplasm of breast: Secondary | ICD-10-CM | POA: Diagnosis not present

## 2015-10-14 DIAGNOSIS — G4726 Circadian rhythm sleep disorder, shift work type: Secondary | ICD-10-CM

## 2015-10-14 DIAGNOSIS — Z0000 Encounter for general adult medical examination without abnormal findings: Secondary | ICD-10-CM | POA: Diagnosis not present

## 2015-10-14 DIAGNOSIS — R7989 Other specified abnormal findings of blood chemistry: Secondary | ICD-10-CM

## 2015-10-14 MED ORDER — DOXEPIN HCL 3 MG PO TABS: 1.0000 | ORAL_TABLET | Freq: Every evening | ORAL | 5 refills | Status: DC | PRN

## 2015-10-14 NOTE — Patient Instructions (Signed)
Health Maintenance, Female Adopting a healthy lifestyle and getting preventive care can go a long way to promote health and wellness. Talk with your health care provider about what schedule of regular examinations is right for you. This is a good chance for you to check in with your provider about disease prevention and staying healthy. In between checkups, there are plenty of things you can do on your own. Experts have done a lot of research about which lifestyle changes and preventive measures are most likely to keep you healthy. Ask your health care provider for more information. WEIGHT AND DIET  Eat a healthy diet  Be sure to include plenty of vegetables, fruits, low-fat dairy products, and lean protein.  Do not eat a lot of foods high in solid fats, added sugars, or salt.  Get regular exercise. This is one of the most important things you can do for your health.  Most adults should exercise for at least 150 minutes each week. The exercise should increase your heart rate and make you sweat (moderate-intensity exercise).  Most adults should also do strengthening exercises at least twice a week. This is in addition to the moderate-intensity exercise.  Maintain a healthy weight  Body mass index (BMI) is a measurement that can be used to identify possible weight problems. It estimates body fat based on height and weight. Your health care provider can help determine your BMI and help you achieve or maintain a healthy weight.  For females 20 years of age and older:   A BMI below 18.5 is considered underweight.  A BMI of 18.5 to 24.9 is normal.  A BMI of 25 to 29.9 is considered overweight.  A BMI of 30 and above is considered obese.  Watch levels of cholesterol and blood lipids  You should start having your blood tested for lipids and cholesterol at 57 years of age, then have this test every 5 years.  You may need to have your cholesterol levels checked more often if:  Your lipid  or cholesterol levels are high.  You are older than 57 years of age.  You are at high risk for heart disease.  CANCER SCREENING   Lung Cancer  Lung cancer screening is recommended for adults 55-80 years old who are at high risk for lung cancer because of a history of smoking.  A yearly low-dose CT scan of the lungs is recommended for people who:  Currently smoke.  Have quit within the past 15 years.  Have at least a 30-pack-year history of smoking. A pack year is smoking an average of one pack of cigarettes a day for 1 year.  Yearly screening should continue until it has been 15 years since you quit.  Yearly screening should stop if you develop a health problem that would prevent you from having lung cancer treatment.  Breast Cancer  Practice breast self-awareness. This means understanding how your breasts normally appear and feel.  It also means doing regular breast self-exams. Let your health care provider know about any changes, no matter how small.  If you are in your 20s or 30s, you should have a clinical breast exam (CBE) by a health care provider every 1-3 years as part of a regular health exam.  If you are 40 or older, have a CBE every year. Also consider having a breast X-ray (mammogram) every year.  If you have a family history of breast cancer, talk to your health care provider about genetic screening.  If you   are at high risk for breast cancer, talk to your health care provider about having an MRI and a mammogram every year.  Breast cancer gene (BRCA) assessment is recommended for women who have family members with BRCA-related cancers. BRCA-related cancers include:  Breast.  Ovarian.  Tubal.  Peritoneal cancers.  Results of the assessment will determine the need for genetic counseling and BRCA1 and BRCA2 testing. Cervical Cancer Your health care provider may recommend that you be screened regularly for cancer of the pelvic organs (ovaries, uterus, and  vagina). This screening involves a pelvic examination, including checking for microscopic changes to the surface of your cervix (Pap test). You may be encouraged to have this screening done every 3 years, beginning at age 21.  For women ages 30-65, health care providers may recommend pelvic exams and Pap testing every 3 years, or they may recommend the Pap and pelvic exam, combined with testing for human papilloma virus (HPV), every 5 years. Some types of HPV increase your risk of cervical cancer. Testing for HPV may also be done on women of any age with unclear Pap test results.  Other health care providers may not recommend any screening for nonpregnant women who are considered low risk for pelvic cancer and who do not have symptoms. Ask your health care provider if a screening pelvic exam is right for you.  If you have had past treatment for cervical cancer or a condition that could lead to cancer, you need Pap tests and screening for cancer for at least 20 years after your treatment. If Pap tests have been discontinued, your risk factors (such as having a new sexual partner) need to be reassessed to determine if screening should resume. Some women have medical problems that increase the chance of getting cervical cancer. In these cases, your health care provider may recommend more frequent screening and Pap tests. Colorectal Cancer  This type of cancer can be detected and often prevented.  Routine colorectal cancer screening usually begins at 57 years of age and continues through 57 years of age.  Your health care provider may recommend screening at an earlier age if you have risk factors for colon cancer.  Your health care provider may also recommend using home test kits to check for hidden blood in the stool.  A small camera at the end of a tube can be used to examine your colon directly (sigmoidoscopy or colonoscopy). This is done to check for the earliest forms of colorectal  cancer.  Routine screening usually begins at age 50.  Direct examination of the colon should be repeated every 5-10 years through 57 years of age. However, you may need to be screened more often if early forms of precancerous polyps or small growths are found. Skin Cancer  Check your skin from head to toe regularly.  Tell your health care provider about any new moles or changes in moles, especially if there is a change in a mole's shape or color.  Also tell your health care provider if you have a mole that is larger than the size of a pencil eraser.  Always use sunscreen. Apply sunscreen liberally and repeatedly throughout the day.  Protect yourself by wearing long sleeves, pants, a wide-brimmed hat, and sunglasses whenever you are outside. HEART DISEASE, DIABETES, AND HIGH BLOOD PRESSURE   High blood pressure causes heart disease and increases the risk of stroke. High blood pressure is more likely to develop in:  People who have blood pressure in the high end   of the normal range (130-139/85-89 mm Hg).  People who are overweight or obese.  People who are African American.  If you are 38-23 years of age, have your blood pressure checked every 3-5 years. If you are 61 years of age or older, have your blood pressure checked every year. You should have your blood pressure measured twice--once when you are at a hospital or clinic, and once when you are not at a hospital or clinic. Record the average of the two measurements. To check your blood pressure when you are not at a hospital or clinic, you can use:  An automated blood pressure machine at a pharmacy.  A home blood pressure monitor.  If you are between 45 years and 39 years old, ask your health care provider if you should take aspirin to prevent strokes.  Have regular diabetes screenings. This involves taking a blood sample to check your fasting blood sugar level.  If you are at a normal weight and have a low risk for diabetes,  have this test once every three years after 57 years of age.  If you are overweight and have a high risk for diabetes, consider being tested at a younger age or more often. PREVENTING INFECTION  Hepatitis B  If you have a higher risk for hepatitis B, you should be screened for this virus. You are considered at high risk for hepatitis B if:  You were born in a country where hepatitis B is common. Ask your health care provider which countries are considered high risk.  Your parents were born in a high-risk country, and you have not been immunized against hepatitis B (hepatitis B vaccine).  You have HIV or AIDS.  You use needles to inject street drugs.  You live with someone who has hepatitis B.  You have had sex with someone who has hepatitis B.  You get hemodialysis treatment.  You take certain medicines for conditions, including cancer, organ transplantation, and autoimmune conditions. Hepatitis C  Blood testing is recommended for:  Everyone born from [birth year] through 1967.  Anyone with known risk factors for hepatitis C. Sexually transmitted infections (STIs)  You should be screened for sexually transmitted infections (STIs) including gonorrhea and chlamydia if:  You are sexually active and are younger than 57 years of age.  You are older than 57 years of age and your health care provider tells you that you are at risk for this type of infection.  Your sexual activity has changed since you were last screened and you are at an increased risk for chlamydia or gonorrhea. Ask your health care provider if you are at risk.  If you do not have HIV, but are at risk, it may be recommended that you take a prescription medicine daily to prevent HIV infection. This is called pre-exposure prophylaxis (PrEP). You are considered at risk if:  You are sexually active and do not regularly use condoms or know the HIV status of your partner(s).  You take drugs by injection.  You are sexually  active with a partner who has HIV. Talk with your health care provider about whether you are at high risk of being infected with HIV. If you choose to begin PrEP, you should first be tested for HIV. You should then be tested every 3 months for as long as you are taking PrEP.  PREGNANCY   If you are premenopausal and you may become pregnant, ask your health care provider about preconception counseling.  If you may  become pregnant, take 400 to 800 micrograms (mcg) of folic acid every day.  If you want to prevent pregnancy, talk to your health care provider about birth control (contraception). OSTEOPOROSIS AND MENOPAUSE   Osteoporosis is a disease in which the bones lose minerals and strength with aging. This can result in serious bone fractures. Your risk for osteoporosis can be identified using a bone density scan.  If you are 61 years of age or older, or if you are at risk for osteoporosis and fractures, ask your health care provider if you should be screened.  Ask your health care provider whether you should take a calcium or vitamin D supplement to lower your risk for osteoporosis.  Menopause may have certain physical symptoms and risks.  Hormone replacement therapy may reduce some of these symptoms and risks. Talk to your health care provider about whether hormone replacement therapy is right for you.  HOME CARE INSTRUCTIONS   Schedule regular health, dental, and eye exams.  Stay current with your immunizations.   Do not use any tobacco products including cigarettes, chewing tobacco, or electronic cigarettes.  If you are pregnant, do not drink alcohol.  If you are breastfeeding, limit how much and how often you drink alcohol.  Limit alcohol intake to no more than 1 drink per day for nonpregnant women. One drink equals 12 ounces of beer, 5 ounces of wine, or 1 ounces of hard liquor.  Do not use street drugs.  Do not share needles.  Ask your health care provider for help if  you need support or information about quitting drugs.  Tell your health care provider if you often feel depressed.  Tell your health care provider if you have ever been abused or do not feel safe at home.   This information is not intended to replace advice given to you by your health care provider. Make sure you discuss any questions you have with your health care provider.   Document Released: 09/12/2010 Document Revised: 03/20/2014 Document Reviewed: 01/29/2013 Elsevier Interactive Patient Education [employer].

## 2015-10-14 NOTE — Progress Notes (Signed)
Patient: Sara Figueroa, Female    DOB: 09-07-1960, 55 y.o.   MRN: [id number] Visit Date: 10/14/2015  Today's Provider: Mar Daring, PA-C   Chief Complaint  Patient presents with  . Annual Exam   Subjective:    Annual physical exam Sara Figueroa is a 55 y.o. female who presents today for health maintenance and complete physical. She feels well. She reports exercising -yoga every morning for 35 minutes. She reports she is sleeping well, takes medication.   She was seen in [facility] ED for palpitations which was found to be frequent PVCs. She states she is still having them, mostly at night, but they have not been as severe as they were on 10/07/14. She denies chest pain, SOB, headache, or visual changes with them. No pre-syncope or syncopal prodrome noted. She does have a f/u with Cardiology, Dr. Yvone Neu, on 11/02/15.  Mammogram:08/05/2013 Colonoscopy:06/03/2013 BMD:08/05/2013 -----------------------------------------------------------------   Review of Systems  Constitutional: Negative.   HENT: Positive for sinus pressure and tinnitus (sometimes).   Eyes: Negative.   Respiratory: Negative.   Cardiovascular: Positive for palpitations (Seen at ED on 10/07/15-PVC's).  Gastrointestinal: Negative.   Endocrine: Negative.   Genitourinary: Positive for frequency and urgency.  Musculoskeletal: Positive for arthralgias, back pain and gait problem.       Left foot,Right hip,knee,thigh,lower back  Skin: Negative.   Allergic/Immunologic: Positive for environmental allergies.  Neurological: Positive for tremors (Left hand at rest).  Hematological: Negative.   Psychiatric/Behavioral: Positive for sleep disturbance.    Social History      She  reports that she has quit smoking. Her smoking use included Cigarettes. She has a 10.00 pack-year smoking history. She has never used smokeless tobacco. She reports that she drinks alcohol. She reports that she does not use drugs.         Social History   Social History  . Marital status: Married    Spouse name: N/A  . Number of children: N/A  . Years of education: N/A   Social History Main Topics  . Smoking status: Former Smoker    Packs/day: 0.50    Years: 20.00    Types: Cigarettes  . Smokeless tobacco: Never Used     Comment: quit in 2006  . Alcohol use Yes     Comment: DRINKS 1 GLASS OF WINE EVERY OTHER NIGHT  . Drug use: No  . Sexual activity: Not Asked   Other Topics Concern  . None   Social History Narrative  . None    Past Medical History:  Diagnosis Date  . Allergy    seasonal  . Arthritis      Patient Active Problem List   Diagnosis Date Noted  . Elevated hemoglobin A1c 10/01/2014  . Urinary frequency 10/01/2014  . Low TSH level 10/01/2014  . Right hip pain 10/01/2014  . Lumbago due to displacement of intervertebral disc 10/01/2014  . Shift work sleep disorder 10/01/2014  . Cystocele 10/01/2014  . Absolute anemia 09/11/2014  . Bradycardia 09/11/2014  . Compression fracture 09/11/2014  . Fracture of lumbar spine ([place]) 09/11/2014  . Cold sore 09/11/2014  . Cannot sleep 09/11/2014  . Personal history of urinary calculi 09/11/2014  . Asymptomatic postmenopausal status 09/11/2014    Past Surgical History:  Procedure Laterality Date  . cyst on eyebrow     right  . REFRACTIVE SURGERY  2008   Bil    Family History        Family  Status  Relation Status  . Mother Alive  . Father Deceased at age 88  . Sister Alive  . Brother Alive  . Maternal Grandmother Deceased  . Maternal Grandfather Deceased  . Paternal Grandmother Deceased        Her family history includes Atrial fibrillation in her mother; Cancer in her maternal grandfather and maternal grandmother; Congestive Heart Failure in her paternal grandmother; Emphysema in her father; Fibroids in her sister; Heart disease in her mother; Hypertension in her mother; Parkinson's disease in her father; Skin cancer in her father;  Stroke in her father; Ulcerative colitis in her mother.    No Known Allergies  Current Meds  Medication Sig  . Calcium Carb-Cholecalciferol (CALCIUM PLUS VITAMIN D3) 600-800 MG-UNIT TABS Take 1 tablet by mouth daily.  . Doxepin HCl 3 MG TABS Take 1 tablet (3 mg total) by mouth at bedtime as needed.  . Glucosamine Sulfate (GLUCOSAMINE RELIEF) 1000 MG TABS Take by mouth every morning.  Marland Kitchen ibuprofen (ADVIL,MOTRIN) 200 MG tablet Take 200 mg by mouth 3 (three) times daily.  Marland Kitchen loratadine (CLARITIN) 10 MG tablet Take 10 mg by mouth daily.  . Multiple Vitamin (MULTIVITAMIN) tablet Take 1 tablet by mouth daily.  . Omega-3 Fatty Acids (FISH OIL) 1000 MG CAPS Take 1 capsule by mouth daily.  Marland Kitchen omeprazole (PRILOSEC) 20 MG capsule Take 20 mg by mouth as needed.  . valACYclovir (VALTREX) 1000 MG tablet Take 1 tablet (1,000 mg total) by mouth 2 (two) times daily.    Patient Care Team: Mar Daring, PA-C as PCP - General (Physician Assistant)     Objective:   Vitals: BP (!) 160/98 (BP Location: Left Arm, Patient Position: Sitting, Cuff Size: Normal)   Pulse 80   Temp 98.2 F (36.8 C) (Oral)   Ht 5\' 5"  (1.651 m)   Wt 177 lb 6.4 oz (80.5 kg)   BMI 29.52 kg/m    Physical Exam  Constitutional: She is oriented to person, place, and time. She appears well-developed and well-nourished. No distress.  HENT:  Head: Normocephalic and atraumatic.  Right Ear: External ear normal.  Left Ear: External ear normal.  Nose: Nose normal.  Mouth/Throat: Oropharynx is clear and moist. No oropharyngeal exudate.  Eyes: Conjunctivae and EOM are normal. Pupils are equal, round, and reactive to light. Right eye exhibits no discharge. Left eye exhibits no discharge. No scleral icterus.  Neck: Normal range of motion. Neck supple. No JVD present. No tracheal deviation present. No thyromegaly present.  Cardiovascular: Normal rate, regular rhythm, normal heart sounds and intact distal pulses.  Exam reveals no gallop  and no friction rub.   No murmur heard. Pulmonary/Chest: Effort normal and breath sounds normal. No respiratory distress. She has no wheezes. She has no rales. She exhibits no tenderness.  Abdominal: Soft. Bowel sounds are normal. She exhibits no distension and no mass. There is no tenderness. There is no rebound and no guarding.  Genitourinary:  Genitourinary Comments: Deferred because done last year.  Musculoskeletal: Normal range of motion. She exhibits edema (trace; right > left). She exhibits no tenderness.  Lymphadenopathy:    She has no cervical adenopathy.  Neurological: She is alert and oriented to person, place, and time.  Skin: Skin is warm and dry. No rash noted. She is not diaphoretic.  Psychiatric: She has a normal mood and affect. Her behavior is normal. Judgment and thought content normal.  Vitals reviewed.    Depression Screen PHQ 2/9 Scores 10/14/2015  PHQ -  2 Score 0      Assessment & Plan:     Routine Health Maintenance and Physical Exam  Exercise Activities and Dietary recommendations Goals    None      Immunization History  Administered Date(s) Administered  . Tdap 07/03/2005    Health Maintenance  Topic Date Due  . HIV Screening  07/15/1973  . TETANUS/TDAP  07/04/2015  . MAMMOGRAM  08/06/2015  . INFLUENZA VACCINE  10/12/2015  . PAP SMEAR  09/30/2017  . COLONOSCOPY  06/04/2018  . Hepatitis C Screening  Completed      Discussed health benefits of physical activity, and encouraged her to engage in regular exercise appropriate for her age and condition.   1. Annual physical exam Normal physical exam today. Will check labs as below and f/u pending lab results. If labs are stable and WNL she will not need to have these rechecked for one year at her next annual physical exam. She is to call the office in the meantime if she has any acute issue, questions or concerns. - CBC with Differential - Comprehensive metabolic panel  2. Screening for  breast cancer Breast exam today was normal. There is no family history of breast cancer. She does perform regular self breast exams. Mammogram was ordered as below. Information for [facility] clinic was given to patient so she may schedule her mammogram at her convenience. - MM DIGITAL SCREENING BILATERAL; Future  3. Need for vaccine for TD (tetanus-diphtheria) Td booster given without complication. Record of immunization given to patient. - Td : Tetanus/diphtheria >7yo Preservative  free  4. Elevated hemoglobin A1c Will check labs as below and f/u pending results. - Hemoglobin A1c  5. Low TSH level Will check labs as below and f/u pending results. - TSH  6. Encounter for lipid screening for cardiovascular disease Will check labs as below and f/u pending results. - Lipid panel  7. Shift work sleep disorder Stable. Diagnosis pulled for medication refill. Continue current medical treatment plan. - Doxepin HCl 3 MG TABS; Take 1 tablet (3 mg total) by mouth at bedtime as needed.  Dispense: 30 tablet; Refill: 5   --------------------------------------------------------------------    Mar Daring, PA-C  [facility]

## 2015-10-20 ENCOUNTER — Other Ambulatory Visit
Admission: RE | Admit: 2015-10-20 | Discharge: 2015-10-20 | Disposition: A | Discharge status: Home / Self Care | Payer: 59 | Source: Ambulatory Visit | Attending: Physician Assistant | Admitting: Physician Assistant

## 2015-10-20 DIAGNOSIS — R7309 Other abnormal glucose: Secondary | ICD-10-CM | POA: Insufficient documentation

## 2015-10-20 DIAGNOSIS — R946 Abnormal results of thyroid function studies: Secondary | ICD-10-CM | POA: Insufficient documentation

## 2015-10-20 DIAGNOSIS — Z13220 Encounter for screening for lipoid disorders: Secondary | ICD-10-CM | POA: Insufficient documentation

## 2015-10-20 DIAGNOSIS — Z136 Encounter for screening for cardiovascular disorders: Secondary | ICD-10-CM | POA: Diagnosis not present

## 2015-10-20 DIAGNOSIS — Z0000 Encounter for general adult medical examination without abnormal findings: Secondary | ICD-10-CM | POA: Diagnosis not present

## 2015-10-20 LAB — COMPREHENSIVE METABOLIC PANEL WITH GFR
ALT: 15 U/L (ref 14–54)
AST: 14 U/L — ABNORMAL LOW (ref 15–41)
Albumin: 4.4 g/dL (ref 3.5–5.0)
Alkaline Phosphatase: 69 U/L (ref 38–126)
Anion gap: 7 (ref 5–15)
BUN: 12 mg/dL (ref 6–20)
CO2: 27 mmol/L (ref 22–32)
Calcium: 9.5 mg/dL (ref 8.9–10.3)
Chloride: 104 mmol/L (ref 101–111)
Creatinine, Ser: 0.73 mg/dL (ref 0.44–1.00)
GFR calc Af Amer: >60 mL/min
GFR calc non Af Amer: >60 mL/min
Glucose, Bld: 102 mg/dL — ABNORMAL HIGH (ref 65–99)
Potassium: 4.4 mmol/L (ref 3.5–5.1)
Sodium: 138 mmol/L (ref 135–145)
Total Bilirubin: 0.9 mg/dL (ref 0.3–1.2)
Total Protein: 7.4 g/dL (ref 6.5–8.1)

## 2015-10-20 LAB — CBC WITH DIFFERENTIAL/PLATELET
BASOS ABS: 0.1 10*3/uL (ref 0–0.1)
BASOS PCT: 1 %
Eosinophils Absolute: 0.2 10*3/uL (ref 0–0.7)
Eosinophils Relative: 3 %
HEMATOCRIT: 39.6 % (ref 35.0–47.0)
HEMOGLOBIN: 13.1 g/dL (ref 12.0–16.0)
Lymphocytes Relative: 30 %
Lymphs Abs: 2 10*3/uL (ref 1.0–3.6)
MCH: 30.5 pg (ref 26.0–34.0)
MCHC: 33.1 g/dL (ref 32.0–36.0)
MCV: 92 fL (ref 80.0–100.0)
MONOS PCT: 10 %
Monocytes Absolute: 0.7 10*3/uL (ref 0.2–0.9)
NEUTROS ABS: 3.6 10*3/uL (ref 1.4–6.5)
NEUTROS PCT: 56 %
Platelets: 256 10*3/uL (ref 150–440)
RBC: 4.3 MIL/uL (ref 3.80–5.20)
RDW: 13.4 % (ref 11.5–14.5)
WBC: 6.6 10*3/uL (ref 3.6–11.0)

## 2015-10-20 LAB — LIPID PANEL
Cholesterol: 191 mg/dL (ref 0–200)
HDL: 79 mg/dL
LDL Cholesterol: 94 mg/dL (ref 0–99)
Total CHOL/HDL Ratio: 2.4 ratio
Triglycerides: 88 mg/dL
VLDL: 18 mg/dL (ref 0–40)

## 2015-10-20 LAB — TSH: TSH: 0.676 u[IU]/mL (ref 0.350–4.500)

## 2015-10-20 LAB — HEMOGLOBIN A1C: Hgb A1c MFr Bld: 5.4 % (ref 4.0–6.0)

## 2015-10-21 ENCOUNTER — Telehealth: Payer: Self-pay

## 2015-10-21 NOTE — Telephone Encounter (Signed)
Patient advised as directed below.  Thanks,  -Vonnie Ligman 

## 2015-10-21 NOTE — Telephone Encounter (Signed)
LMTCB  Thanks,  -Jonanthan Bolender 

## 2015-10-21 NOTE — Telephone Encounter (Signed)
-----   Message from Mar Daring, [place] sent at 10/20/2015  3:09 PM EDT ----- All labs are within normal limits and stable.  Cholesterol continues to improve. Keep up healthy lifestyle modifications. Thanks! -JB

## 2015-11-02 ENCOUNTER — Encounter: Payer: Self-pay | Admitting: Cardiology

## 2015-11-02 ENCOUNTER — Ambulatory Visit (INDEPENDENT_AMBULATORY_CARE_PROVIDER_SITE_OTHER): Payer: 59 | Admitting: Cardiology

## 2015-11-02 VITALS — BP 160/84 | HR 60 | Ht 65.0 in | Wt 175.5 lb

## 2015-11-02 DIAGNOSIS — R002 Palpitations: Secondary | ICD-10-CM

## 2015-11-02 NOTE — Patient Instructions (Signed)
Testing/Procedures: Your physician has recommended that you wear an event monitor. Event monitors are medical devices that record the heart's electrical activity. Doctors most often [place] these monitors to diagnose arrhythmias. Arrhythmias are problems with the speed or rhythm of the heartbeat. The monitor is a small, portable device. You can wear one while you do your normal daily activities. This is usually used to diagnose what is causing palpitations/syncope (passing out).  Your physician has requested that you have an echocardiogram. Echocardiography is a painless test that uses sound waves to create images of your heart. It provides your doctor with information about the size and shape of your heart and how well your heart's chambers and valves are working. This procedure takes approximately one hour. There are no restrictions for this procedure.   Follow-Up: Your physician recommends that you schedule a follow-up appointment as needed. We will call you with results and if needed schedule follow up appointment at that time.  It was a pleasure seeing you today here in the office. Please do not hesitate to give [place] a call back if you have any further questions. [place], BSN

## 2015-11-02 NOTE — Progress Notes (Signed)
Cardiology Office Note   Date:  11/02/2015   ID:  Sara Figueroa, Sara Figueroa 03-03-1961, MRN [id number]  Referring Doctor:  Mar Daring, PA-C   Cardiologist:   Wende Bushy, MD   Reason for consultation:  Chief Complaint  Patient presents with  . Other    F/u [facility] irregular heart beat. Meds reviewed verbally with pt.      History of Present Illness: Sara Figueroa is a 55 y.o. female who presents for Palpitations/skipped beats/racing heart rate. First noted a month ago. It was bothersome enough that she ended up in the emergency room. She was told that she had PVCs. There is no associated chest pain or shortness of breath. Happens occasionally. Since onset one month ago, she's had 3 other separate locations. Episodes would last minutes at a time. Not really related to anything in particular. The related to exertion at all.  No PND, orthopnea, edema. No headache, fever, cough, colds, abdominal pain.  ROS:  Please see the history of present illness. Aside from mentioned under HPI, all other systems are reviewed and negative.     Past Medical History:  Diagnosis Date  . Allergy    seasonal  . Arthritis   . Heart murmur of newborn     Past Surgical History:  Procedure Laterality Date  . cyst on eyebrow     right  . REFRACTIVE SURGERY  2008   Bil     reports that she has quit smoking. Her smoking use included Cigarettes. She has a 10.00 pack-year smoking history. She has never used smokeless tobacco. She reports that she drinks alcohol. She reports that she does not use drugs.   family history includes Atrial fibrillation in her mother; Cancer in her maternal grandfather and maternal grandmother; Congestive Heart Failure in her paternal grandmother; Emphysema in her father; Fibroids in her sister; Heart disease in her mother; Heart failure in her paternal grandmother; Hypertension in her brother and mother; Parkinson's disease in her father; Skin cancer in her father;  Stroke in her father; Ulcerative colitis in her mother.   Outpatient Medications Prior to Visit  Medication Sig Dispense Refill  . Calcium Carb-Cholecalciferol (CALCIUM PLUS VITAMIN D3) 600-800 MG-UNIT TABS Take 1 tablet by mouth daily.    . Doxepin HCl 3 MG TABS Take 1 tablet (3 mg total) by mouth at bedtime as needed. 30 tablet 5  . Glucosamine Sulfate (GLUCOSAMINE RELIEF) 1000 MG TABS Take by mouth every morning.    Marland Kitchen ibuprofen (ADVIL,MOTRIN) 200 MG tablet Take 200 mg by mouth daily as needed.     . loratadine (CLARITIN) 10 MG tablet Take 10 mg by mouth daily as needed.     . Multiple Vitamin (MULTIVITAMIN) tablet Take 1 tablet by mouth daily.    . Omega-3 Fatty Acids (FISH OIL) 1000 MG CAPS Take 1 capsule by mouth daily.    Marland Kitchen omeprazole (PRILOSEC) 20 MG capsule Take 20 mg by mouth as needed.    . valACYclovir (VALTREX) 1000 MG tablet Take 1 tablet (1,000 mg total) by mouth 2 (two) times daily. 60 tablet 4   No facility-administered medications prior to visit.      Allergies: Review of patient's allergies indicates no known allergies.    PHYSICAL EXAM: VS:  BP (!) 160/84 (BP Location: Right Arm, Patient Position: Sitting, Cuff Size: Normal)   Pulse 60   Ht 5\' 5"  (1.651 m)   Wt 175 lb 8 oz (79.6 kg)   BMI 29.20  kg/m  , Body mass index is 29.2 kg/m. Wt Readings from Last 3 Encounters:  11/02/15 175 lb 8 oz (79.6 kg)  10/14/15 177 lb 6.4 oz (80.5 kg)  10/06/15 175 lb (79.4 kg)    GENERAL:  well developed, well nourished, not in acute distress HEENT: normocephalic, pink conjunctivae, anicteric sclerae, no xanthelasma, normal dentition, oropharynx clear NECK:  no neck vein engorgement, JVP normal, no hepatojugular reflux, carotid upstroke brisk and symmetric, no bruit, no thyromegaly, no lymphadenopathy LUNGS:  good respiratory effort, clear to auscultation bilaterally CV:  PMI not displaced, no thrills, no lifts, S1 and S2 within normal limits, no palpable S3 or S4, no murmurs, no  rubs, no gallops ABD:  Soft, nontender, nondistended, normoactive bowel sounds, no abdominal aortic bruit, no hepatomegaly, no splenomegaly MS: nontender back, no kyphosis, no scoliosis, no joint deformities EXT:  2+ DP/PT pulses, no edema, no varicosities, no cyanosis, no clubbing SKIN: warm, nondiaphoretic, normal turgor, no ulcers NEUROPSYCH: alert, oriented to person, place, and time, sensory/motor grossly intact, normal mood, appropriate affect  Recent Labs: 10/07/2015: Magnesium 2.1 10/20/2015: ALT 15; BUN 12; Creatinine, Ser 0.73; Hemoglobin 13.1; Platelets 256; Potassium 4.4; Sodium 138; TSH 0.676   Lipid Panel    Component Value Date/Time   CHOL 191 10/20/2015 0850   TRIG 88 10/20/2015 0850   HDL 79 10/20/2015 0850   CHOLHDL 2.4 10/20/2015 0850   VLDL 18 10/20/2015 0850   LDLCALC 94 10/20/2015 0850     Other studies Reviewed:  EKG:  The ekg from 11/02/2015 was personally reviewed by me and it revealed sinus rhythm, 60 BPM  Additional studies/ records that were reviewed personally reviewed by me today include: None available   ASSESSMENT AND PLAN: Palpitations Recommend cardiac event monitor. Symptoms don't occur on daily basis.  Elevated blood pressure Blood pressure at home is much better mostly less than 140/80. Likely whitecoat hypertension. Patient advised to monitor blood pressure routinely.   Current medicines are reviewed at length with the patient today.  The patient does not have concerns regarding medicines.  Labs/ tests ordered today include:  Orders Placed This Encounter  Procedures  . EKG 12-Lead    I had a lengthy and detailed discussion with the patient regarding diagnoses, prognosis, diagnostic options, treatment options.   I counseled the patient on importance of lifestyle modification including heart healthy diet, regular physical activity .   Disposition:   FU with undersigned after tests   Signed, Wende Bushy, MD  11/02/2015 11:34 AM     [place]  This note was generated in part with voice recognition software and I apologize for any typographical errors that were not detected and corrected.

## 2015-11-06 ENCOUNTER — Ambulatory Visit (INDEPENDENT_AMBULATORY_CARE_PROVIDER_SITE_OTHER): Payer: 59

## 2015-11-06 DIAGNOSIS — R002 Palpitations: Secondary | ICD-10-CM | POA: Diagnosis not present

## 2015-11-23 ENCOUNTER — Ambulatory Visit (INDEPENDENT_AMBULATORY_CARE_PROVIDER_SITE_OTHER): Payer: 59

## 2015-11-23 ENCOUNTER — Other Ambulatory Visit: Payer: Self-pay

## 2015-11-23 DIAGNOSIS — R002 Palpitations: Secondary | ICD-10-CM

## 2015-12-08 ENCOUNTER — Other Ambulatory Visit: Payer: Self-pay | Admitting: Physician Assistant

## 2015-12-08 ENCOUNTER — Ambulatory Visit
Admission: RE | Admit: 2015-12-08 | Discharge: 2015-12-08 | Disposition: A | Discharge status: Home / Self Care | Payer: 59 | Source: Ambulatory Visit | Attending: Physician Assistant | Admitting: Physician Assistant

## 2015-12-08 DIAGNOSIS — Z1239 Encounter for other screening for malignant neoplasm of breast: Secondary | ICD-10-CM

## 2015-12-08 DIAGNOSIS — Z1231 Encounter for screening mammogram for malignant neoplasm of breast: Secondary | ICD-10-CM | POA: Insufficient documentation

## 2016-01-04 DIAGNOSIS — H5212 Myopia, left eye: Secondary | ICD-10-CM | POA: Diagnosis not present

## 2016-01-04 DIAGNOSIS — H52221 Regular astigmatism, right eye: Secondary | ICD-10-CM | POA: Diagnosis not present

## 2016-01-04 DIAGNOSIS — H524 Presbyopia: Secondary | ICD-10-CM | POA: Diagnosis not present

## 2016-10-17 ENCOUNTER — Encounter: Payer: Self-pay | Admitting: Physician Assistant

## 2016-10-17 ENCOUNTER — Ambulatory Visit (INDEPENDENT_AMBULATORY_CARE_PROVIDER_SITE_OTHER): Payer: 59 | Admitting: Physician Assistant

## 2016-10-17 VITALS — BP 140/84 | HR 60 | Temp 98.2°F | Resp 16 | Ht 65.0 in | Wt 161.4 lb

## 2016-10-17 DIAGNOSIS — Z0000 Encounter for general adult medical examination without abnormal findings: Secondary | ICD-10-CM

## 2016-10-17 DIAGNOSIS — R0781 Pleurodynia: Secondary | ICD-10-CM | POA: Diagnosis not present

## 2016-10-17 DIAGNOSIS — Z6826 Body mass index (BMI) 26.0-26.9, adult: Secondary | ICD-10-CM

## 2016-10-17 DIAGNOSIS — Z1239 Encounter for other screening for malignant neoplasm of breast: Secondary | ICD-10-CM

## 2016-10-17 DIAGNOSIS — G4726 Circadian rhythm sleep disorder, shift work type: Secondary | ICD-10-CM

## 2016-10-17 DIAGNOSIS — M25472 Effusion, left ankle: Secondary | ICD-10-CM

## 2016-10-17 DIAGNOSIS — R7309 Other abnormal glucose: Secondary | ICD-10-CM

## 2016-10-17 DIAGNOSIS — R7989 Other specified abnormal findings of blood chemistry: Secondary | ICD-10-CM

## 2016-10-17 DIAGNOSIS — N8110 Cystocele, unspecified: Secondary | ICD-10-CM | POA: Diagnosis not present

## 2016-10-17 DIAGNOSIS — M25551 Pain in right hip: Secondary | ICD-10-CM | POA: Diagnosis not present

## 2016-10-17 DIAGNOSIS — R946 Abnormal results of thyroid function studies: Secondary | ICD-10-CM

## 2016-10-17 DIAGNOSIS — Z114 Encounter for screening for human immunodeficiency virus [HIV]: Secondary | ICD-10-CM

## 2016-10-17 DIAGNOSIS — B001 Herpesviral vesicular dermatitis: Secondary | ICD-10-CM | POA: Diagnosis not present

## 2016-10-17 DIAGNOSIS — Z1231 Encounter for screening mammogram for malignant neoplasm of breast: Secondary | ICD-10-CM

## 2016-10-17 MED ORDER — VALACYCLOVIR HCL 1 G PO TABS: 1000.0000 mg | ORAL_TABLET | Freq: Two times a day (BID) | ORAL | 4 refills | Status: DC

## 2016-10-17 MED ORDER — DOXEPIN HCL 3 MG PO TABS: 1.0000 | ORAL_TABLET | Freq: Every evening | ORAL | 5 refills | Status: DC | PRN

## 2016-10-17 NOTE — Progress Notes (Signed)
Patient: Sara Figueroa, Female    DOB: December 22, 1960, 56 y.o.   MRN: [id number] Visit Date: 10/17/2016  Today's Provider: Mar Daring, PA-C   Chief Complaint  Patient presents with  . Annual Exam   Subjective:    Annual physical exam [name] is a 56 y.o. female who presents today for health maintenance and complete physical. She feels well. She reports exercising none, she does PT and is doing stretching. She reports she is sleeping well, with Doxepin.  Last CPE:10/14/15 Pap:10/01/14 Pap-Negative Mammogram:12/08/15 BI-RADS 1 Colonoscopy:06/03/13 Polyps, Recheck in 10 yrs ----------------------------------------------------------------- Patient is also requesting refill on Doxepin and Valtrex.  She is moving to [facility] and has been stressed more with trying to clean her house and get rid of things. She is going to continue to work here and return M-Th. She is going to stay with family while she is here then return to [facility].   She also reports that while moving she was reaching to get something over a bar. She was leaning and tried to stretch further and felt and heard a pop on the right side. She had immediate pain. It has been improving since. She denies any SOB or difficulty breathing.   She also has swelling noted over the left foot. She had an xray done in 2016 for a similar issue and had OA noted. She also has multiple varicosities and reticular/spider veins. She has been seen by a vein clinic and has had multiple sclerotherapies on reticular veins.   Review of Systems  Constitutional: Negative.   HENT: Negative.   Eyes: Negative.   Respiratory: Negative.   Cardiovascular: Negative.   Gastrointestinal: Negative.   Endocrine: Negative.   Genitourinary: Positive for urgency.  Musculoskeletal: Positive for arthralgias, back pain, gait problem, joint swelling and myalgias.  Allergic/Immunologic: Positive for environmental allergies.    Neurological: Negative for dizziness, weakness, light-headedness, numbness and headaches.  Hematological: Negative.   Psychiatric/Behavioral: Negative.     Social History      She  reports that she has quit smoking. Her smoking use included Cigarettes. She has a 10.00 pack-year smoking history. She has never used smokeless tobacco. She reports that she drinks alcohol. She reports that she does not use drugs.       Social History   Social History  . Marital status: Married    Spouse name: N/A  . Number of children: N/A  . Years of education: N/A   Social History Main Topics  . Smoking status: Former Smoker    Packs/day: 0.50    Years: 20.00    Types: Cigarettes  . Smokeless tobacco: Never Used     Comment: quit in 2006  . Alcohol use Yes     Comment: DRINKS 1 GLASS OF WINE EVERY OTHER NIGHT  . Drug use: No  . Sexual activity: Not Asked   Other Topics Concern  . None   Social History Narrative  . None    Past Medical History:  Diagnosis Date  . Allergy    seasonal  . Arthritis   . Heart murmur of newborn      Patient Active Problem List   Diagnosis Date Noted  . Elevated hemoglobin A1c 10/01/2014  . Urinary frequency 10/01/2014  . Low TSH level 10/01/2014  . Right hip pain 10/01/2014  . Lumbago due to displacement of intervertebral disc 10/01/2014  . Shift work sleep disorder 10/01/2014  . Cystocele 10/01/2014  .  Absolute anemia 09/11/2014  . Bradycardia 09/11/2014  . Compression fracture 09/11/2014  . Fracture of lumbar spine ([place]) 09/11/2014  . Cold sore 09/11/2014  . Cannot sleep 09/11/2014  . Personal history of urinary calculi 09/11/2014  . Asymptomatic postmenopausal status 09/11/2014    Past Surgical History:  Procedure Laterality Date  . cyst on eyebrow     right  . REFRACTIVE SURGERY  2008   Bil    Family History        Family Status  Relation Status  . Mother Alive  . Father Deceased at age 83  . Sister Alive  . Brother Alive  .  MGM Deceased  . MGF Deceased  . PGM Deceased  . Neg Hx (Not Specified)        Her family history includes Atrial fibrillation in her mother; Cancer in her maternal grandfather and maternal grandmother; Congestive Heart Failure in her paternal grandmother; Emphysema in her father; Fibroids in her sister; Heart disease in her mother; Heart failure in her paternal grandmother; Hypertension in her brother and mother; Parkinson's disease in her father; Skin cancer in her father; Stroke in her father; Ulcerative colitis in her mother.     No Known Allergies   Current Outpatient Prescriptions:  .  Calcium Carb-Cholecalciferol (CALCIUM PLUS VITAMIN D3) 600-800 MG-UNIT TABS, Take 1 tablet by mouth daily., Disp: , Rfl:  .  Doxepin HCl 3 MG TABS, Take 1 tablet (3 mg total) by mouth at bedtime as needed., Disp: 30 tablet, Rfl: 5 .  ibuprofen (ADVIL,MOTRIN) 200 MG tablet, Take 200 mg by mouth daily as needed. , Disp: , Rfl:  .  loratadine (CLARITIN) 10 MG tablet, Take 10 mg by mouth daily as needed. , Disp: , Rfl:  .  Multiple Vitamin (MULTIVITAMIN) tablet, Take 1 tablet by mouth daily., Disp: , Rfl:  .  omeprazole (PRILOSEC) 20 MG capsule, Take 20 mg by mouth as needed., Disp: , Rfl:  .  valACYclovir (VALTREX) 1000 MG tablet, Take 1 tablet (1,000 mg total) by mouth 2 (two) times daily., Disp: 60 tablet, Rfl: 4   Patient Care Team: Mar Daring, PA-C as PCP - General (Physician Assistant)      Objective:   Vitals: BP 140/84 (BP Location: Right Arm, Patient Position: Sitting, Cuff Size: Normal)   Pulse 60   Temp 98.2 F (36.8 C) (Oral)   Resp 16   Ht '5\' 5"'$  (1.651 m)   Wt 161 lb 6.4 oz (73.2 kg)   BMI 26.86 kg/m     Physical Exam  Constitutional: She is oriented to person, place, and time. She appears well-developed and well-nourished. No distress.  HENT:  Head: Normocephalic and atraumatic.  Right Ear: Hearing, tympanic membrane, external ear and ear canal normal.  Left Ear:  Hearing, tympanic membrane, external ear and ear canal normal.  Nose: Nose normal.  Mouth/Throat: Uvula is midline, oropharynx is clear and moist and mucous membranes are normal. No oropharyngeal exudate.  Eyes: Pupils are equal, round, and reactive to light. Conjunctivae and EOM are normal. Right eye exhibits no discharge. Left eye exhibits no discharge. No scleral icterus.  Neck: Normal range of motion. Neck supple. No JVD present. Carotid bruit is not present. No tracheal deviation present. No thyromegaly present.  Cardiovascular: Normal rate, regular rhythm, normal heart sounds and intact distal pulses.  Exam reveals no gallop and no friction rub.   No murmur heard. Pulmonary/Chest: Effort normal and breath sounds normal. No respiratory distress. She has no  wheezes. She has no rales. She exhibits no tenderness. Right breast exhibits no inverted nipple, no mass, no nipple discharge, no skin change and no tenderness. Left breast exhibits no inverted nipple, no mass, no nipple discharge, no skin change and no tenderness. Breasts are symmetrical.  Abdominal: Soft. Bowel sounds are normal. She exhibits no distension and no mass. There is no tenderness. There is no rebound and no guarding.  Musculoskeletal: Normal range of motion. She exhibits no edema or tenderness.  Lymphadenopathy:    She has no cervical adenopathy.  Neurological: She is alert and oriented to person, place, and time. She has normal reflexes.  Skin: Skin is warm and dry. No rash noted. She is not diaphoretic.  Psychiatric: She has a normal mood and affect. Her behavior is normal. Judgment and thought content normal.  Vitals reviewed.   Depression Screen PHQ 2/9 Scores 10/17/2016 10/14/2015  PHQ - 2 Score 0 0      Assessment & Plan:     Routine Health Maintenance and Physical Exam  Exercise Activities and Dietary recommendations Goals    None      Immunization History  Administered Date(s) Administered  . MMR  07/28/1992  . Td 10/14/2015  . Tdap 07/03/2005    Health Maintenance  Topic Date Due  . HIV Screening  07/15/1973  . INFLUENZA VACCINE  10/11/2016  . PAP SMEAR  09/30/2017  . MAMMOGRAM  12/07/2017  . COLONOSCOPY  06/04/2018  . TETANUS/TDAP  10/13/2025  . Hepatitis C Screening  Completed     Discussed health benefits of physical activity, and encouraged her to engage in regular exercise appropriate for her age and condition.    1. Annual physical exam Normal physical exam today. Will check labs as below and f/u pending lab results. If labs are stable and WNL she will not need to have these rechecked for one year at her next annual physical exam. She is to call the office in the meantime if she has any acute issue, questions or concerns. - CBC with Differential/Platelet - Comprehensive metabolic panel - Lipid panel  2. Female cystocele Discussed conservative management with pelvic floor therapy vs pessary vs surgical intervention. Patient reports she is maintaining well at this time but wanted information to research for the future.   3. Elevated hemoglobin A1c Will check labs as below and f/u pending results. - Comprehensive metabolic panel - Hemoglobin A1c - Lipid panel  4. Low TSH level Will check labs as below and f/u pending results. - TSH  5. Right hip pain Previously done PT in 2016. Still exercising and doing yoga to help strengthen.   6. Shift work sleep disorder Stable. Diagnosis pulled for medication refill. Continue current medical treatment plan. - Doxepin HCl 3 MG TABS; Take 1 tablet (3 mg total) by mouth at bedtime as needed.  Dispense: 30 tablet; Refill: 5  7. BMI 26.0-26.9,adult Patient is exercising and doing yoga regularly.   8. Herpes labialis Stable. Diagnosis pulled for medication refill. Continue current medical treatment plan. - valACYclovir (VALTREX) 1000 MG tablet; Take 1 tablet (1,000 mg total) by mouth 2 (two) times daily.  Dispense: 60  tablet; Refill: 4  9. Rib pain on right side Possible dislocation vs contusion. Patient declines xray as she is not having any difficulty breathing. She is going to treat conservatively. Discussed deep breathing exercises. Advised to not do any heavy lifting by herself and to avoid heavy lifting with twisting. She is to call if symptoms worsen.  10. Left ankle swelling Possibly secondary to venous insuff. She is going to start wearing a compression stocking to see if this helps her symptoms. She is going to follow up with Dr. Elvina Mattes if pain worsens. Patient also has worsening hammer toe of the 2nd toe on the left foot.   11. Breast cancer screening Normal mammogram. Repeat screening in one year. - MM DIGITAL SCREENING BILATERAL; Future  12. Screening for HIV without presence of risk factors - HIV antibody (with reflex)  --------------------------------------------------------------------    Mar Daring, PA-C  [facility]

## 2016-10-17 NOTE — Patient Instructions (Signed)
Pelvic Organ Prolapse Pelvic organ prolapse is the stretching, bulging, or dropping of pelvic organs into an abnormal position. It happens when the muscles and tissues that surround and support pelvic structures are stretched or weak. Pelvic organ prolapse can involve:  Vagina (vaginal prolapse).  Uterus (uterine prolapse).  Bladder (cystocele).  Rectum (rectocele).  Intestines (enterocele).  When organs other than the vagina are involved, they often bulge into the vagina or protrude from the vagina, depending on how severe the prolapse is. What are the causes? Causes of this condition include:  Pregnancy, labor, and childbirth.  Long-lasting (chronic) cough.  Chronic constipation.  Obesity.  Past pelvic surgery.  Aging. During and after menopause, a decreased production of the hormone estrogen can weaken pelvic ligaments and muscles.  Consistently lifting more than 50 lb (23 kg).  Buildup of fluid in the abdomen due to certain diseases and other conditions.  What are the signs or symptoms? Symptoms of this condition include:  Loss of bladder control when you cough, sneeze, strain, and exercise (stress incontinence). This may be worse immediately following childbirth, and it may gradually improve over time.  Feeling pressure in your pelvis or vagina. This pressure may increase when you cough or when you are having a bowel movement.  A bulge that protrudes from the opening of your vagina or against your vaginal wall. If your uterus protrudes through the opening of your vagina and rubs against your clothing, you may also experience soreness, ulcers, infection, pain, and bleeding.  Increased effort to have a bowel movement or urinate.  Pain in your low back.  Pain, discomfort, or disinterest in sexual intercourse.  Repeated bladder infections (urinary tract infections).  Difficulty inserting or inability to insert a tampon or applicator.  In some people, this  condition does not cause any symptoms. How is this diagnosed? Your health care provider may perform an internal and external vaginal and rectal exam. During the exam, you may be asked to cough and strain while you are lying down, sitting, and standing up. Your health care provider will determine if other tests are required, such as bladder function tests. How is this treated? In most cases, this condition needs to be treated only if it produces symptoms. No treatment is guaranteed to correct the prolapse or relieve the symptoms completely. Treatment may include:  Lifestyle changes, such as: ? Avoiding drinking beverages that contain caffeine. ? Increasing your intake of high-fiber foods. This can help to decrease constipation and straining during bowel movements. ? Emptying your bladder at scheduled times (bladder training therapy). This can help to reduce or avoid urinary incontinence. ? Losing weight if you are overweight or obese.  Estrogen. Estrogen may help mild prolapse by increasing the strength and tone of pelvic floor muscles.  Kegel exercises. These may help mild cases of prolapse by strengthening and tightening the muscles of the pelvic floor.  Pessary insertion. A pessary is a soft, flexible device that is placed into your vagina by your health care provider to help support the vaginal walls and keep pelvic organs in place.  Surgery. This is often the only form of treatment for severe prolapse. Different types of surgeries are available.  Follow these instructions at home:  Wear a sanitary pad or absorbent product if you have urinary incontinence.  Avoid heavy lifting and straining with exercise and work. Do not hold your breath when you perform mild to moderate lifting and exercise activities. Limit your activities as directed by your health care   provider.  Take medicines only as directed by your health care provider.  Perform Kegel exercises as directed by your health care  provider.  If you have a pessary, take care of it as directed by your health care provider. Contact a health care provider if:  Your symptoms interfere with your daily activities or sex life.  You need medicine to help with the discomfort.  You notice bleeding from the vagina that is not related to your period.  You have a fever.  You have pain or bleeding when you urinate.  You have bleeding when you have a bowel movement.  You lose urine when you have sex.  You have chronic constipation.  You have a pessary that falls out.  You have vaginal discharge that has a bad smell.  You have low abdominal pain or cramping that is unusual for you. This information is not intended to replace advice given to you by your health care provider. Make sure you discuss any questions you have with your health care provider. Document Released: 09/24/2013 Document Revised: 08/05/2015 Document Reviewed: 05/12/2013 Elsevier Interactive Patient Education  2018 [employer] Inc[employer]    Cystocele Repair Cystocele repair is surgery to fix a cystocele, which is a bulging, drooping area (hernia) of the bladder that extends into the vagina. This bulging occurs on the top front (anterior) wall of the vagina. The condition may also be called an anterior prolapse or a prolapsed bladder. Tell a health care provider about:  Any allergies you have.  All medicines you are taking, including vitamins, herbs, eye drops, creams, and over-the-counter medicines.  Any problems you or family members have had with anesthetic medicines.  Any blood disorders you have.  Any surgeries you have had.  Any medical conditions you have.  Whether you are pregnant or may be pregnant. What are the risks? Generally, this is a safe procedure. However, problems may occur, including:  Infection.  Too much bleeding.  Allergic reactions to medicines.  Damage to other structures or organs.  Problems with the urinary drainage  tube (catheter), such as blockage.  Return of the cystocele.  Problems with the vaginal mesh.  What happens before the procedure? Staying hydrated Follow instructions from your health care provider about hydration, which may include:  Up to 2 hours before the procedure - you may continue to drink clear liquids, such as water, clear fruit juice, black coffee, and plain tea.  Eating and drinking restrictions Follow instructions from your health care provider about eating and drinking, which may include:  8 hours before the procedure - stop eating heavy meals or foods such as meat, fried foods, or fatty foods.  6 hours before the procedure - stop eating light meals or foods, such as toast or cereal.  6 hours before the procedure - stop drinking milk or drinks that contain milk.  2 hours before the procedure - stop drinking clear liquids.  Medicines  Ask your health care provider about: ? Changing or stopping your regular medicines. This is especially important if you are taking diabetes medicines or blood thinners. ? Taking medicines such as aspirin and ibuprofen. These medicines can thin your blood. Do not take these medicines before your procedure if your health care provider instructs you not to.  You may be given antibiotic medicine to help prevent infection. General instructions  Do not use any products that contain nicotine or tobacco-such as cigarettes and e-cigarettes-for at least 2 weeks before the procedure. If you need help quitting,   ask your health care provider.  Do not drink any alcohol for 3 days before the surgery.  Plan to have someone take you home from the hospital or clinic.  Ask your health care provider how your surgical site will be marked or identified. What happens during the procedure?  To reduce your risk of infection: ? Your health care team will wash or sanitize their hands. ? Your skin will be washed with soap.  You will be given one or more of  the following: ? A medicine to make you fall asleep (general anesthetic). ? A medicine that is injected into your spine to numb the area below and slightly above the injection site (spinal anesthetic). ? A medicine that is injected into an area of your body to numb everything below the injection site (regional anesthetic).  A thin, flexible tube (indwelling urinary catheter) will be placed in your bladder to drain urine during and after the surgery.  The surgery will be performed through the vagina. An incision will be made in the front wall of the vagina.  The muscle between the bladder and vagina will be pulled up to its normal position. Stitches (sutures) or a piece of mesh will be used to support the muscle and hold it in place. This will remove the hernia so the top of the bladder does not fall into the opening of the vagina.  The incision on the front wall of the vagina will then be closed with stitches that will dissolve safely into your body and do not need to be removed. The procedure may vary among health care providers and hospitals. What happens after the procedure?  Your blood pressure, heart rate, breathing rate, and blood oxygen level will be monitored until the medicines you were given have worn off.  You will have a catheter in place to drain your bladder. This will stay in place for 2-7 days or until your bladder is working well on its own.  You may have gauze packing in the vagina. This will be removed 1-2 days after the surgery.  You will be given pain medicine as needed.  You may be given antibiotics to fight infection. This information is not intended to replace advice given to you by your health care provider. Make sure you discuss any questions you have with your health care provider. Document Released: 02/25/2000 Document Revised: 10/01/2015 Document Reviewed: 05/20/2015 Elsevier Interactive Patient Education  2018 [employer]vier Inc.  

## 2016-10-23 ENCOUNTER — Encounter: Payer: Self-pay | Admitting: Physician Assistant

## 2016-10-24 ENCOUNTER — Other Ambulatory Visit
Admission: RE | Admit: 2016-10-24 | Discharge: 2016-10-24 | Disposition: A | Discharge status: Home / Self Care | Payer: 59 | Source: Ambulatory Visit | Attending: Physician Assistant | Admitting: Physician Assistant

## 2016-10-24 DIAGNOSIS — Z0000 Encounter for general adult medical examination without abnormal findings: Secondary | ICD-10-CM | POA: Insufficient documentation

## 2016-10-24 LAB — COMPREHENSIVE METABOLIC PANEL
ALBUMIN: 4.1 g/dL (ref 3.5–5.0)
ALK PHOS: 69 U/L (ref 38–126)
ALT: 13 U/L — ABNORMAL LOW (ref 14–54)
ANION GAP: 8 (ref 5–15)
AST: 15 U/L (ref 15–41)
BUN: 13 mg/dL (ref 6–20)
CALCIUM: 9.4 mg/dL (ref 8.9–10.3)
CHLORIDE: 105 mmol/L (ref 101–111)
CO2: 26 mmol/L (ref 22–32)
Creatinine, Ser: 0.65 mg/dL (ref 0.44–1.00)
GFR calc non Af Amer: >60 mL/min (ref >60)
Glucose, Bld: 99 mg/dL (ref 65–99)
POTASSIUM: 4.6 mmol/L (ref 3.5–5.1)
SODIUM: 139 mmol/L (ref 135–145)
Total Bilirubin: 0.8 mg/dL (ref 0.3–1.2)
Total Protein: 7.3 g/dL (ref 6.5–8.1)

## 2016-10-24 LAB — LIPID PANEL
CHOL/HDL RATIO: 2.1 ratio
Cholesterol: 190 mg/dL (ref 0–200)
HDL: 90 mg/dL (ref >40)
LDL CALC: 82 mg/dL (ref 0–99)
TRIGLYCERIDES: 92 mg/dL (ref <150)
VLDL: 18 mg/dL (ref 0–40)

## 2016-10-24 LAB — CBC WITH DIFFERENTIAL/PLATELET
BASOS PCT: 1 %
Basophils Absolute: 0.1 10*3/uL (ref 0–0.1)
EOS PCT: 3 %
Eosinophils Absolute: 0.2 10*3/uL (ref 0–0.7)
HCT: 35 % (ref 35.0–47.0)
Hemoglobin: 11.7 g/dL — ABNORMAL LOW (ref 12.0–16.0)
LYMPHS ABS: 1.6 10*3/uL (ref 1.0–3.6)
Lymphocytes Relative: 22 %
MCH: 30.6 pg (ref 26.0–34.0)
MCHC: 33.3 g/dL (ref 32.0–36.0)
MCV: 91.8 fL (ref 80.0–100.0)
MONO ABS: 0.6 10*3/uL (ref 0.2–0.9)
MONOS PCT: 9 %
Neutro Abs: 4.7 10*3/uL (ref 1.4–6.5)
Neutrophils Relative %: 65 %
PLATELETS: 292 10*3/uL (ref 150–440)
RBC: 3.81 MIL/uL (ref 3.80–5.20)
RDW: 13.9 % (ref 11.5–14.5)
WBC: 7.1 10*3/uL (ref 3.6–11.0)

## 2016-10-24 LAB — TSH: TSH: 0.848 u[IU]/mL (ref 0.350–4.500)

## 2016-10-25 ENCOUNTER — Telehealth: Payer: Self-pay

## 2016-10-25 LAB — MISC LABCORP TEST (SEND OUT): Labcorp test code: 1453

## 2016-10-25 NOTE — Telephone Encounter (Signed)
-----   Message from Mar Daring, [place] sent at 10/25/2016  8:31 AM EDT ----- All labs are stable and WNL with exception of hemoglobin that has dropped from 13s where you have been over last 2 years to 11.7. Would recommend patient come pick up some hemoccult cards to check for blood in stool as source of drop. She can also add iron supplement once daily and recheck CBC in 6 weeks as well.

## 2016-10-25 NOTE — Telephone Encounter (Signed)
Patient advised as below. Patient reports she will come in about a week for stool cards.

## 2016-10-26 ENCOUNTER — Other Ambulatory Visit: Payer: Self-pay | Admitting: Physician Assistant

## 2016-10-26 DIAGNOSIS — G4726 Circadian rhythm sleep disorder, shift work type: Secondary | ICD-10-CM

## 2016-10-26 MED ORDER — DOXEPIN HCL 3 MG PO TABS: 1.0000 | ORAL_TABLET | Freq: Every evening | ORAL | 1 refills | Status: DC | PRN

## 2016-10-26 NOTE — Progress Notes (Signed)
Doxepin refilled to [facility] per patient request

## 2017-04-13 DIAGNOSIS — M25551 Pain in right hip: Secondary | ICD-10-CM | POA: Diagnosis not present

## 2017-04-13 DIAGNOSIS — M1611 Unilateral primary osteoarthritis, right hip: Secondary | ICD-10-CM | POA: Diagnosis not present

## 2017-05-21 ENCOUNTER — Encounter: Payer: Self-pay | Admitting: Physician Assistant

## 2017-06-04 ENCOUNTER — Encounter: Payer: Self-pay | Admitting: Physician Assistant

## 2017-06-05 ENCOUNTER — Encounter: Payer: Self-pay | Admitting: Physician Assistant

## 2017-06-05 ENCOUNTER — Ambulatory Visit (INDEPENDENT_AMBULATORY_CARE_PROVIDER_SITE_OTHER): Payer: 59 | Admitting: Physician Assistant

## 2017-06-05 VITALS — BP 128/82 | HR 64 | Temp 97.9°F | Resp 16 | Ht 64.5 in | Wt 169.0 lb

## 2017-06-05 DIAGNOSIS — Z01818 Encounter for other preprocedural examination: Secondary | ICD-10-CM

## 2017-06-05 LAB — POCT URINALYSIS DIPSTICK
Bilirubin, UA: NEGATIVE
Blood, UA: NEGATIVE
Glucose, UA: NEGATIVE
Ketones, UA: NEGATIVE
LEUKOCYTES UA: NEGATIVE
Nitrite, UA: NEGATIVE
PH UA: 6.5 (ref 5.0–8.0)
Protein, UA: NEGATIVE
Spec Grav, UA: 1.02 (ref 1.010–1.025)
UROBILINOGEN UA: 0.2 U/dL

## 2017-06-05 NOTE — Progress Notes (Signed)
Patient: Sara Figueroa Female    DOB: 14-Sep-1960   57 y.o.   MRN: [id number] Visit Date: 06/05/2017  Today's Provider: Mar Daring, PA-C   Chief Complaint  Patient presents with  . Pre-op Exam   Subjective:    HPI Patient comes in today for a pre-op exam. She feels well today with no complaints. She is having surgery on her right hip on 07/03/2017 by [facility]. No previous surgery with general anesthesia. No complications with local or propofol (colonoscopy).     No Known Allergies   Current Outpatient Medications:  .  Calcium Carb-Cholecalciferol (CALCIUM PLUS VITAMIN D3) 600-800 MG-UNIT TABS, Take 1 tablet by mouth daily., Disp: , Rfl:  .  Doxepin HCl 3 MG TABS, Take 1 tablet (3 mg total) by mouth at bedtime as needed., Disp: 90 tablet, Rfl: 1 .  ibuprofen (ADVIL,MOTRIN) 200 MG tablet, Take 200 mg by mouth daily as needed. , Disp: , Rfl:  .  loratadine (CLARITIN) 10 MG tablet, Take 10 mg by mouth daily as needed. , Disp: , Rfl:  .  Multiple Vitamin (MULTIVITAMIN) tablet, Take 1 tablet by mouth daily., Disp: , Rfl:  .  omeprazole (PRILOSEC) 20 MG capsule, Take 20 mg by mouth as needed., Disp: , Rfl:  .  valACYclovir (VALTREX) 1000 MG tablet, Take 1 tablet (1,000 mg total) by mouth 2 (two) times daily., Disp: 60 tablet, Rfl: 4  Review of Systems  Constitutional: Negative.   Respiratory: Negative.   Cardiovascular: Negative for chest pain and leg swelling.  Musculoskeletal: Positive for arthralgias.  Neurological: Negative.   Psychiatric/Behavioral: Negative.     Social History   Tobacco Use  . Smoking status: Former Smoker    Packs/day: 0.50    Years: 20.00    Pack years: 10.00    Types: Cigarettes  . Smokeless tobacco: Never Used  . Tobacco comment: quit in 2006  Substance Use Topics  . Alcohol use: Yes    Comment: DRINKS 1 GLASS OF WINE EVERY OTHER NIGHT   Objective:   BP 128/82 (BP Location: Left Arm, Patient Position: Sitting, Cuff Size:  Normal)   Pulse 64   Temp 97.9 F (36.6 C)   Resp 16   Ht 5' 4.5" (1.638 m)   Wt 169 lb (76.7 kg)   SpO2 99%   BMI 28.56 kg/m  Vitals:   06/05/17 0841  BP: 128/82  Pulse: 64  Resp: 16  Temp: 97.9 F (36.6 C)  SpO2: 99%  Weight: 169 lb (76.7 kg)  Height: 5' 4.5" (1.638 m)     Physical Exam  Constitutional: She appears well-developed and well-nourished. No distress.  Neck: Normal range of motion. Neck supple. No JVD present. No tracheal deviation present. No thyromegaly present.  Cardiovascular: Normal rate, regular rhythm and normal heart sounds. Exam reveals no gallop and no friction rub.  No murmur heard. Pulmonary/Chest: Effort normal and breath sounds normal. No respiratory distress. She has no wheezes. She has no rales.  Musculoskeletal: She exhibits no edema.  Lymphadenopathy:    She has no cervical adenopathy.  Skin: She is not diaphoretic.  Vitals reviewed.      Assessment & Plan:     1. Pre-op examination EKG shows NSR rate of 69 with poor R wave progression noted, unchanged from 2017. Labs unremarkable as checked as below. Labs and EKG will be sent with surgical clearance. Lyndel Safe cardiovasular risk is low at 0.2% and [facility] Criteria is very  low risk at 0.4%. Patient is considered low risk for surgical complications and cleared. - EKG 12-Lead - CBC w/Diff/Platelet - Basic Metabolic Panel (BMET) - HgB A1c - POCT urinalysis dipstick       Mar Daring, PA-C  [facility]

## 2017-06-05 NOTE — Patient Instructions (Signed)
Preparing for Hip Replacement Recovery from hip replacement surgery can be made easier and more comfortable by being prepared before surgery. This includes:  Arranging for others to help you.  Preparing your home.  Preparing your body by having a preoperative exam and being as healthy as you can.  Doing exercises before your surgery as directed by your health care provider.  You can ease any concerns about your financial responsibilities by calling your insurance company after you decide to have surgery. In addition to asking about your surgery and hospital stay, you will want to ask about coverage for medical equipment, rehabilitation facilities, and home care. How should I arrange for help? You will be stronger and more mobile every day. However, in the first couple weeks after surgery, it is unlikely you will be able to do all your daily activities as easily as before your surgery. You may tire easily and will still have limited movement in your leg. Follow these guidelines to best arrange for the help you may need after your surgery:  Plan to have someone take you home after the procedure. Your health care provider will be able to tell you how many days you can expect to be in the hospital.  Cancel all work, caregiving, and volunteer responsibilities for at least 4-6 weeks after surgery.  If you live alone, arrange for someone to care for your home and pets for the first 4-6 weeks after surgery.  Select someone with whom you feel comfortable to be with you day and night for the first week. This person will help you with your exercises and personal care, such as bathing and using the toilet.  Arrange for drivers to bring you to and from your follow-up appointments, the grocery store, and other places you may need to go for at least 4-6 weeks.  How should I prepare my home?  Pick a recovery spot, but do not plan on recovering in bed. Sitting in a more upright position is better for your  health. You may want to use a recliner with a small table nearby. Choose a chair with a firm seat that will not allow you to sink down into it. Chairs and sofas that are too soft can allow your hip to bend at an angle greater than 90 degrees. This could put you at risk for dislocating your new hip joint. Place the items you use most frequently on the small table. These may include the TV remote, a cordless phone, a book or laptop computer, a water glass, and any other items of your choice.  Remove all clutter from your floors. Also remove any throw rugs.  To see if you will be able to move in your home with a wheeled walker, hold your hands out about 6 inches (15 cm) from your sides. Walk from your recovery spot to your kitchen and bathroom. Then walk from your bed to the bathroom. If you do not hit anything with your hands, you have enough room.  Move the items you use most often in your kitchen, bathroom, and bedroom to shelves and drawers that are at countertop height.  Prepare a few meals to freeze and reheat later.  Consider adding grab bars in the shower and near the toilet.  While you are in the hospital, you will learn about equipment that can be helpful for your recovery. Some of the equipment includes raised toilet seats, tub benches, and shower benches. How should I prepare my body?  Have a   preoperative exam. This will ensure that your body is healthy enough to safely have this surgery. Bring a complete list of all your medicines and supplements, including herbs and vitamins. You may need to have additional tests to ensure your safety.  Have elective dental care and routine cleanings before your surgery. Germs from anywhere in your body, including your mouth, can travel to your new joint and infect it. It is important not to have any dental work performed for at least 3 months after your surgery. After surgery, be sure to tell your dentist about your joint replacement.  Maintain a  healthy diet. Do not change your diet before surgery unless advised to do so by your health care provider.  Do not use any tobacco products, including cigarettes, chewing tobacco, or electronic cigarettes. If you need help quitting, ask your health care provider. Tobacco and nicotine products can delay healing after your surgery.  The day before your surgery, follow your health care provider's directions for showering, eating, drinking, and taking medicines. These directions are for your safety. What kinds of exercises should I do? Your health care provider may have you do the following exercises before your surgery. Be sure to follow the exercise program only as directed by your health care provider. While completing these exercises, remember to stretch for as long as you can, up to 30 seconds. You should only feel a gentle lengthening or release in the stretched tissue. You should not feel pain. Ankle Pumps 1. While sitting on a firm surface with your legs straight out in front of you, move your feet at the ankle joints so that your toes are pulling back toward your chest. 2. Reverse the motion, pointing your toes away from you. 3. Repeat 10-20 times. Complete this exercise 1-2 times per day.  Heel Slides 1. Lie on your back with both knees straight. (If this causes back pain, bend one knee, placing your foot flat on the floor. Keep this leg in this position while doing heel slides with the opposite leg.) 2. Slowly slide one heel back toward your buttocks until you feel a gentle stretch in the front of your knee or thigh. 3. Slowly slide your heel back to the starting position. 4. Repeat 10-20 times, then switch heels and do it again. Complete this exercise 1-2 times per day.  Quadriceps Sets 1. Lie on your back with one leg extended and your opposite knee bent. 2. Gradually tighten the muscles in the front of the thigh of your extended leg. This motion will push the back of the knee down  toward the floor. 3. Hold the muscle as tightly as you can without causing pain for 10 seconds. 4. Relax the muscles slowly and completely in between each repetition. 5. Repeat 10-20 times, then switch legs and do it again. Complete this exercise 1-2 times per day.  Short Arc Kicks 1. Lie on your back. Place a rolled towel (4-6 inches [10-15 cm] high) under one knee so that the knee slightly bends. 2. Raise only the lower leg of your slightly bent leg by tightening the muscles in the front of your thigh. Do not allow your thigh to rise. 3. Hold this position for 5 seconds. 4. Repeat 10-20 times, then switch legs and do it again. Complete this exercise 1-2 times per day.  Straight Leg Raises 1. Lie on your back with one leg extended and your opposite knee bent. 2. Tighten the muscles in the front of the thigh of   your extended leg. Your thigh may shake slightly. 3. Tighten these muscles even more and raise your leg 4-6 inches off the floor. Hold for 3-5 seconds. 4. Keeping these muscles tight, lower your leg. 5. Relax the muscles slowly and completely in between each repetition. 6. Repeat 10-20 times, then switch legs and do it again. Complete this exercise 1-2 times per day.  Arm Chair Push-ups 1. Find a firm, non-wheeled chair with solid armrests. 2. Sitting in the chair, extend one leg straight out in front of you. 3. Lift up your body weight, using your arms and opposite leg. 4. Slowly lower your body weight. 5. Repeat 10-20 times, then switch legs and do it again. Complete this exercise 1-2 times per day.  This information is not intended to replace advice given to you by your health care provider. Make sure you discuss any questions you have with your health care provider. Document Released: 06/03/2010 Document Revised: 08/02/2015 Document Reviewed: 05/22/2013 Elsevier Interactive Patient Education  2018 [employer]vier [employer].  

## 2017-06-06 ENCOUNTER — Telehealth: Payer: Self-pay

## 2017-06-06 LAB — CBC WITH DIFFERENTIAL/PLATELET
BASOS ABS: 0 10*3/uL (ref 0.0–0.2)
Basos: 0 %
EOS (ABSOLUTE): 0.2 10*3/uL (ref 0.0–0.4)
Eos: 2 %
Hematocrit: 39.9 % (ref 34.0–46.6)
Hemoglobin: 13.3 g/dL (ref 11.1–15.9)
Immature Grans (Abs): 0 10*3/uL (ref 0.0–0.1)
Immature Granulocytes: 0 %
LYMPHS ABS: 1.4 10*3/uL (ref 0.7–3.1)
Lymphs: 16 %
MCH: 30.7 pg (ref 26.6–33.0)
MCHC: 33.3 g/dL (ref 31.5–35.7)
MCV: 92 fL (ref 79–97)
MONOS ABS: 0.5 10*3/uL (ref 0.1–0.9)
Monocytes: 6 %
NEUTROS PCT: 76 %
Neutrophils Absolute: 7 10*3/uL (ref 1.4–7.0)
Platelets: 311 10*3/uL (ref 150–379)
RBC: 4.33 x10E6/uL (ref 3.77–5.28)
RDW: 13.4 % (ref 12.3–15.4)
WBC: 9.2 10*3/uL (ref 3.4–10.8)

## 2017-06-06 LAB — BASIC METABOLIC PANEL
BUN/Creatinine Ratio: 23 (ref 9–23)
BUN: 17 mg/dL (ref 6–24)
CHLORIDE: 99 mmol/L (ref 96–106)
CO2: 22 mmol/L (ref 20–29)
Calcium: 10.4 mg/dL — ABNORMAL HIGH (ref 8.7–10.2)
Creatinine, Ser: 0.74 mg/dL (ref 0.57–1.00)
GFR calc Af Amer: 103 mL/min/{1.73_m2} (ref >59)
GFR calc non Af Amer: 90 mL/min/{1.73_m2} (ref >59)
GLUCOSE: 90 mg/dL (ref 65–99)
POTASSIUM: 4.1 mmol/L (ref 3.5–5.2)
Sodium: 138 mmol/L (ref 134–144)

## 2017-06-06 LAB — HEMOGLOBIN A1C
ESTIMATED AVERAGE GLUCOSE: 114 mg/dL
HEMOGLOBIN A1C: 5.6 % (ref 4.8–5.6)

## 2017-06-06 NOTE — Telephone Encounter (Signed)
-----   Message from Mar Daring, PA-C sent at 06/06/2017  8:34 AM EDT ----- Labs are WNL with exception of calcium which is up. I would hold any calcium supplements until post surgery.

## 2017-06-06 NOTE — Telephone Encounter (Signed)
Patient advised as below. Patient verbalizes understanding and is in agreement with treatment plan.  

## 2017-06-06 NOTE — Telephone Encounter (Signed)
lmtcb

## 2017-06-21 NOTE — Patient Instructions (Addendum)
Sara Figueroa  06/21/2017   Your procedure is scheduled on: Tuesday 07/03/2017  Report to [facility] Main  Entrance  Report to admitting at  0900  AM    Call this number if you have problems the morning of surgery [phone]    Remember: Do not eat food or drink liquids :After Midnight.      Take these medicines the morning of surgery with A SIP OF WATER: Valacyclovir (Valtrex)(per order from Dr. Alvan Figueroa)                                 You may not have any metal on your body including hair pins and              piercings  Do not wear jewelry, make-up, lotions, powders or perfumes, deodorant             Do not wear nail polish.  Do not shave  48 hours prior to surgery.              Men may shave face and neck.   Do not bring valuables to the hospital. [place].  Contacts, dentures or bridgework may not be worn into surgery.  Leave suitcase in the car. After surgery it may be brought to your room.                  Please read over the following fact sheets you were given: _____________________________________________________________________             [facility] - Preparing for Surgery Before surgery, you can play an important role.  Because skin is not sterile, your skin needs to be as free of germs as possible.  You can reduce the number of germs on your skin by washing with CHG (chlorahexidine gluconate) soap before surgery.  CHG is an antiseptic cleaner which kills germs and bonds with the skin to continue killing germs even after washing. Please DO NOT use if you have an allergy to CHG or antibacterial soaps.  If your skin becomes reddened/irritated stop using the CHG and inform your nurse when you arrive at Short Stay. Do not shave (including legs and underarms) for at least 48 hours prior to the first CHG shower.  You may shave your face/neck. Please follow these instructions carefully:  1.   Shower with CHG Soap the night before surgery and the  morning of Surgery.  2.  If you choose to wash your hair, wash your hair first as usual with your  normal  shampoo.  3.  After you shampoo, rinse your hair and body thoroughly to remove the  shampoo.                           4.  Use CHG as you would any other liquid soap.  You can apply chg directly  to the skin and wash                       Gently with a scrungie or clean washcloth.  5.  Apply the CHG Soap to your body ONLY FROM THE NECK DOWN.   Do not use on  face/ open                           Wound or open sores. Avoid contact with eyes, ears mouth and genitals (private parts).                       Wash face,  Genitals (private parts) with your normal soap.             6.  Wash thoroughly, paying special attention to the area where your surgery  will be performed.  7.  Thoroughly rinse your body with warm water from the neck down.  8.  DO NOT shower/wash with your normal soap after using and rinsing off  the CHG Soap.                9.  Pat yourself dry with a clean towel.            10.  Wear clean pajamas.            11.  Place clean sheets on your bed the night of your first shower and do not  sleep with pets. Day of Surgery : Do not apply any lotions/deodorants the morning of surgery.  Please wear clean clothes to the hospital/surgery center.  FAILURE TO FOLLOW THESE INSTRUCTIONS MAY RESULT IN THE CANCELLATION OF YOUR SURGERY PATIENT SIGNATURE_________________________________  NURSE SIGNATURE__________________________________  ________________________________________________________________________   Sara Figueroa  An incentive spirometer is a tool that can help keep your lungs clear and active. This tool measures how well you are filling your lungs with each breath. Taking long deep breaths may help reverse or decrease the chance of developing breathing (pulmonary) problems (especially infection) following:  A long  period of time when you are unable to move or be active. BEFORE THE PROCEDURE   If the spirometer includes an indicator to show your best effort, your nurse or respiratory therapist will set it to a desired goal.  If possible, sit up straight or lean slightly forward. Try not to slouch.  Hold the incentive spirometer in an upright position. INSTRUCTIONS FOR USE  1. Sit on the edge of your bed if possible, or sit up as far as you can in bed or on a chair. 2. Hold the incentive spirometer in an upright position. 3. Breathe out normally. 4. Place the mouthpiece in your mouth and seal your lips tightly around it. 5. Breathe in slowly and as deeply as possible, raising the piston or the ball toward the top of the column. 6. Hold your breath for 3-5 seconds or for as long as possible. Allow the piston or ball to fall to the bottom of the column. 7. Remove the mouthpiece from your mouth and breathe out normally. 8. Rest for a few seconds and repeat Steps 1 through 7 at least 10 times every 1-2 hours when you are awake. Take your time and take a few normal breaths between deep breaths. 9. The spirometer may include an indicator to show your best effort. Use the indicator as a goal to work toward during each repetition. 10. After each set of 10 deep breaths, practice coughing to be sure your lungs are clear. If you have an incision (the cut made at the time of surgery), support your incision when coughing by placing a pillow or rolled up towels firmly against it. Once you are able to get out of bed, walk around indoors  and cough well. You may stop using the incentive spirometer when instructed by your caregiver.  RISKS AND COMPLICATIONS  Take your time so you do not get dizzy or light-headed.  If you are in pain, you may need to take or ask for pain medication before doing incentive spirometry. It is harder to take a deep breath if you are having pain. AFTER USE  Rest and breathe slowly and  easily.  It can be helpful to keep track of a log of your progress. Your caregiver can provide you with a simple table to help with this. If you are using the spirometer at home, follow these instructions: [place] IF:   You are having difficultly using the spirometer.  You have trouble using the spirometer as often as instructed.  Your pain medication is not giving enough relief while using the spirometer.  You develop fever of 100.5 F (38.1 C) or higher. SEEK IMMEDIATE MEDICAL CARE IF:   You cough up bloody sputum that had not been present before.  You develop fever of 102 F (38.9 C) or greater.  You develop worsening pain at or near the incision site. MAKE SURE YOU:   Understand these instructions.  Will watch your condition.  Will get help right away if you are not doing well or get worse. Document Released: 07/10/2006 Document Revised: 05/22/2011 Document Reviewed: 09/10/2006 ExitCare Patient Information 2014 ExitCare, [place].   ________________________________________________________________________  WHAT IS A BLOOD TRANSFUSION? Blood Transfusion Information  A transfusion is the replacement of blood or some of its parts. Blood is made up of multiple cells which provide different functions.  Red blood cells carry oxygen and are used for blood loss replacement.  White blood cells fight against infection.  Platelets control bleeding.  Plasma helps clot blood.  Other blood products are available for specialized needs, such as hemophilia or other clotting disorders. BEFORE THE TRANSFUSION  Who gives blood for transfusions?   Healthy volunteers who are fully evaluated to make sure their blood is safe. This is blood bank blood. Transfusion therapy is the safest it has ever been in the practice of medicine. Before blood is taken from a donor, a complete history is taken to make sure that person has no history of diseases nor engages in risky social  behavior (examples are intravenous drug use or sexual activity with multiple partners). The donor's travel history is screened to minimize risk of transmitting infections, such as malaria. The donated blood is tested for signs of infectious diseases, such as HIV and hepatitis. The blood is then tested to be sure it is compatible with you in order to minimize the chance of a transfusion reaction. If you or a relative donates blood, this is often done in anticipation of surgery and is not appropriate for emergency situations. It takes many days to process the donated blood. RISKS AND COMPLICATIONS Although transfusion therapy is very safe and saves many lives, the main dangers of transfusion include:   Getting an infectious disease.  Developing a transfusion reaction. This is an allergic reaction to something in the blood you were given. Every precaution is taken to prevent this. The decision to have a blood transfusion has been considered carefully by your caregiver before blood is given. Blood is not given unless the benefits outweigh the risks. AFTER THE TRANSFUSION  Right after receiving a blood transfusion, you will usually feel much better and more energetic. This is especially true if your red blood cells have gotten low (  anemic). The transfusion raises the level of the red blood cells which carry oxygen, and this usually causes an energy increase.  The nurse administering the transfusion will monitor you carefully for complications. HOME CARE INSTRUCTIONS  No special instructions are needed after a transfusion. You may find your energy is better. Speak with your caregiver about any limitations on activity for underlying diseases you may have. SEEK MEDICAL CARE IF:   Your condition is not improving after your transfusion.  You develop redness or irritation at the intravenous (IV) site. SEEK IMMEDIATE MEDICAL CARE IF:  Any of the following symptoms occur over the next 12 hours:  Shaking  chills.  You have a temperature by mouth above 102 F (38.9 C), not controlled by medicine.  Chest, back, or muscle pain.  People around you feel you are not acting correctly or are confused.  Shortness of breath or difficulty breathing.  Dizziness and fainting.  You get a rash or develop hives.  You have a decrease in urine output.  Your urine turns a dark color or changes to pink, red, or brown. Any of the following symptoms occur over the next 10 days:  You have a temperature by mouth above 102 F (38.9 C), not controlled by medicine.  Shortness of breath.  Weakness after normal activity.  The white part of the eye turns yellow (jaundice).  You have a decrease in the amount of urine or are urinating less often.  Your urine turns a dark color or changes to pink, red, or brown. Document Released: 02/25/2000 Document Revised: 05/22/2011 Document Reviewed: 10/14/2007 [facility] Patient Information 2014 [place], [place].  _______________________________________________________________________

## 2017-06-21 NOTE — Progress Notes (Signed)
Spoke to Dr. Josephine Igo, [facility] face to face about EKG reading from 06/05/2017 and medical history. Per Dr. Josephine Igo, [facility], patient's EKG is  OK for surgery.

## 2017-06-21 NOTE — Progress Notes (Signed)
06/05/2017- noted in [place] w/diff., BMP, HgA1C, U/A and EKG  11/23/2015- noted in Epic- ECHO

## 2017-06-22 ENCOUNTER — Other Ambulatory Visit: Payer: Self-pay

## 2017-06-22 ENCOUNTER — Encounter (HOSPITAL_COMMUNITY): Payer: Self-pay

## 2017-06-22 ENCOUNTER — Encounter (HOSPITAL_COMMUNITY)
Admission: RE | Admit: 2017-06-22 | Discharge: 2017-06-22 | Disposition: A | Discharge status: Home / Self Care | Payer: 59 | Source: Ambulatory Visit | Attending: Orthopedic Surgery | Admitting: Orthopedic Surgery

## 2017-06-22 DIAGNOSIS — Z01812 Encounter for preprocedural laboratory examination: Secondary | ICD-10-CM | POA: Insufficient documentation

## 2017-06-22 DIAGNOSIS — M1611 Unilateral primary osteoarthritis, right hip: Secondary | ICD-10-CM | POA: Insufficient documentation

## 2017-06-22 HISTORY — DX: Other complications of anesthesia, initial encounter: T88.59XA

## 2017-06-22 HISTORY — DX: Adverse effect of unspecified anesthetic, initial encounter: T41.45XA

## 2017-06-22 LAB — SURGICAL PCR SCREEN
MRSA, PCR: NEGATIVE
Staphylococcus aureus: POSITIVE — AB

## 2017-06-22 LAB — TYPE AND SCREEN
ABO/RH(D): B POS
ANTIBODY SCREEN: NEGATIVE

## 2017-06-22 LAB — ABO/RH: ABO/RH(D): B POS

## 2017-06-22 NOTE — H&P (Signed)
TOTAL HIP ADMISSION H&P  Patient is admitted for right total hip arthroplasty, anterior approach.  Subjective:  Chief Complaint:     Right hip primary OA / pain  HPI: Sara Figueroa, 57 y.o. female, has a history of pain and functional disability in the right hip(s) due to arthritis and patient has failed non-surgical conservative treatments for greater than 12 weeks to include NSAID's and/or analgesics and activity modification.  Onset of symptoms was gradual starting ~5 years ago with gradually worsening course since that time.The patient noted no past surgery on the right hip(s).  Patient currently rates pain in the right hip at 8 out of 10 with activity. Patient has worsening of pain with activity and weight bearing, trendelenberg gait, pain that interfers with activities of daily living and pain with passive range of motion. Patient has evidence of periarticular osteophytes and joint space narrowing by imaging studies. This condition presents safety issues increasing the risk of falls.  There is no current active infection.   Risks, benefits and expectations were discussed with the patient.  Risks including but not limited to the risk of anesthesia, blood clots, nerve damage, blood vessel damage, failure of the prosthesis, infection and up to and including death.  Patient understand the risks, benefits and expectations and wishes to proceed with surgery.   PCP: Mar Daring, PA-C  D/C Plans:       Home  Post-op Meds:       No Rx given   Tranexamic Acid:      To be given - IV   Decadron:      Is to be given  FYI:      ASA  Norco  Continue Doxepin  Continue Valtrex  DME:   Pt already has equipment   PT:   No PT   Patient Active Problem List   Diagnosis Date Noted  . Elevated hemoglobin A1c 10/01/2014  . Urinary frequency 10/01/2014  . Low TSH level 10/01/2014  . Right hip pain 10/01/2014  . Lumbago due to displacement of intervertebral disc 10/01/2014  . Shift work  sleep disorder 10/01/2014  . Female cystocele 10/01/2014  . Absolute anemia 09/11/2014  . Bradycardia 09/11/2014  . Compression fracture 09/11/2014  . Fracture of lumbar spine ([place]) 09/11/2014  . Cold sore 09/11/2014  . Cannot sleep 09/11/2014  . Personal history of urinary calculi 09/11/2014  . Asymptomatic postmenopausal status 09/11/2014   Past Medical History:  Diagnosis Date  . Allergy    seasonal  . Arthritis   . Heart murmur of newborn     Past Surgical History:  Procedure Laterality Date  . cyst on eyebrow     right  . REFRACTIVE SURGERY  2008   [place]    No current facility-administered medications for this encounter.    Current Outpatient Medications  Medication Sig Dispense Refill Last Dose  . Doxepin HCl 3 MG TABS Take 1 tablet (3 mg total) by mouth at bedtime as needed. 90 tablet 1 Taking  . ibuprofen (ADVIL,MOTRIN) 200 MG tablet Take 400-800 mg by mouth every 8 (eight) hours as needed for mild pain or moderate pain.    Taking  . loratadine (CLARITIN) 10 MG tablet Take 10 mg by mouth daily as needed.    Taking  . Multiple Vitamin (MULTIVITAMIN) tablet Take 1 tablet by mouth daily.   Taking  . omeprazole (PRILOSEC) 20 MG capsule Take 20 mg by mouth daily as needed.    Taking  . valACYclovir (  VALTREX) 1000 MG tablet Take 1 tablet (1,000 mg total) by mouth 2 (two) times daily. (Patient taking differently: Take 1,000 mg by mouth daily. ) 60 tablet 4 Taking  . Calcium Carb-Cholecalciferol (CALCIUM PLUS VITAMIN D3) 600-800 MG-UNIT TABS Take 1 tablet by mouth daily.   Taking  . ferrous sulfate 325 (65 FE) MG tablet Take 325 mg by mouth daily with breakfast.      No Known Allergies  Social History   Tobacco Use  . Smoking status: Former Smoker    Packs/day: 0.50    Years: 20.00    Pack years: 10.00    Types: Cigarettes  . Smokeless tobacco: Never Used  . Tobacco comment: quit in 2006  Substance Use Topics  . Alcohol use: Yes    Comment: DRINKS 1 GLASS OF WINE  EVERY OTHER NIGHT    Family History  Problem Relation Age of Onset  . Heart disease Mother   . Hypertension Mother   . Ulcerative colitis Mother   . Atrial fibrillation Mother   . Emphysema Father   . Parkinson's disease Father   . Skin cancer Father   . Stroke Father   . Fibroids Sister   . Hypertension Brother   . Cancer Maternal Grandmother   . Cancer Maternal Grandfather   . Congestive Heart Failure Paternal Grandmother   . Heart failure Paternal Grandmother   . Breast cancer Neg Hx      Review of Systems  Constitutional: Negative.   HENT: Negative.   Eyes: Negative.   Respiratory: Negative.   Cardiovascular: Negative.   Gastrointestinal: Negative.   Genitourinary: Positive for frequency (stress incontinence).  Musculoskeletal: Positive for back pain and joint pain.  Skin: Negative.   Neurological: Negative.   Endo/Heme/Allergies: Positive for environmental allergies.  Psychiatric/Behavioral: The patient has insomnia.     Objective:  Physical Exam  Constitutional: She is oriented to person, place, and time. She appears well-developed.  HENT:  Head: Normocephalic.  Eyes: Pupils are equal, round, and reactive to light.  Neck: Neck supple. No JVD present. No tracheal deviation present. No thyromegaly present.  Cardiovascular: Normal rate, regular rhythm and intact distal pulses.  Respiratory: Effort normal and breath sounds normal. No respiratory distress. She has no wheezes.  GI: Soft. There is no tenderness. There is no guarding.  Musculoskeletal:       Right hip: She exhibits decreased range of motion, decreased strength, tenderness and bony tenderness. She exhibits no swelling, no deformity and no laceration.  Lymphadenopathy:    She has no cervical adenopathy.  Neurological: She is alert and oriented to person, place, and time.  Skin: Skin is warm and dry.  Psychiatric: She has a normal mood and affect.      Labs:  Estimated body mass index is 28.56  kg/m as calculated from the following:   Height as of 06/05/17: 5' 4.5" (1.638 m).   Weight as of 06/05/17: 76.7 kg (169 lb).   Imaging Review Plain radiographs demonstrate severe degenerative joint disease of the right hip(s). The bone quality appears to be good for age and reported activity level.    Preoperative templating of the joint replacement has been completed, documented, and submitted to the Operating Room personnel in order to optimize intra-operative equipment management.    Assessment/Plan:  End stage arthritis, right hip  The patient history, physical examination, clinical judgement of the provider and imaging studies are consistent with end stage degenerative joint disease of the right hip(s) and total hip  arthroplasty is deemed medically necessary. The treatment options including medical management, injection therapy, arthroscopy and arthroplasty were discussed at length. The risks and benefits of total hip arthroplasty were presented and reviewed. The risks due to aseptic loosening, infection, stiffness, dislocation/subluxation,  thromboembolic complications and other imponderables were discussed.  The patient acknowledged the explanation, agreed to proceed with the plan and consent was signed. Patient is being admitted for inpatient treatment for surgery, pain control, PT, OT, prophylactic antibiotics, VTE prophylaxis, progressive ambulation and ADL's and discharge planning.The patient is planning to be discharged home.     West Pugh Renne Platts   PA-C  06/22/2017, 9:36 AM

## 2017-07-02 MED ORDER — SODIUM CHLORIDE 0.9 % IV SOLN
1000.0000 mg | INTRAVENOUS | Status: AC
  Administered 2017-07-03: 1000 mg via INTRAVENOUS
  Filled 2017-07-02: qty 1100

## 2017-07-03 ENCOUNTER — Inpatient Hospital Stay (HOSPITAL_COMMUNITY): Payer: 59 | Admitting: Registered Nurse

## 2017-07-03 ENCOUNTER — Encounter (HOSPITAL_COMMUNITY): Payer: Self-pay

## 2017-07-03 ENCOUNTER — Inpatient Hospital Stay (HOSPITAL_COMMUNITY): Payer: 59

## 2017-07-03 ENCOUNTER — Inpatient Hospital Stay (HOSPITAL_COMMUNITY)
Admission: RE | Admit: 2017-07-03 | Discharge: 2017-07-04 | DRG: 470 | Disposition: A | Discharge status: Home / Self Care | Payer: 59 | Source: Ambulatory Visit | Attending: Orthopedic Surgery | Admitting: Orthopedic Surgery

## 2017-07-03 ENCOUNTER — Other Ambulatory Visit: Payer: Self-pay

## 2017-07-03 ENCOUNTER — Encounter (HOSPITAL_COMMUNITY)
Admission: RE | Disposition: A | Discharge status: Home / Self Care | Payer: Self-pay | Source: Ambulatory Visit | Attending: Orthopedic Surgery

## 2017-07-03 DIAGNOSIS — Z96649 Presence of unspecified artificial hip joint: Secondary | ICD-10-CM

## 2017-07-03 DIAGNOSIS — Z6825 Body mass index (BMI) 25.0-25.9, adult: Secondary | ICD-10-CM

## 2017-07-03 DIAGNOSIS — Z79899 Other long term (current) drug therapy: Secondary | ICD-10-CM | POA: Diagnosis not present

## 2017-07-03 DIAGNOSIS — Z96641 Presence of right artificial hip joint: Secondary | ICD-10-CM | POA: Diagnosis not present

## 2017-07-03 DIAGNOSIS — D649 Anemia, unspecified: Secondary | ICD-10-CM | POA: Diagnosis not present

## 2017-07-03 DIAGNOSIS — E663 Overweight: Secondary | ICD-10-CM | POA: Diagnosis present

## 2017-07-03 DIAGNOSIS — M1611 Unilateral primary osteoarthritis, right hip: Secondary | ICD-10-CM | POA: Diagnosis not present

## 2017-07-03 DIAGNOSIS — Z87891 Personal history of nicotine dependence: Secondary | ICD-10-CM | POA: Diagnosis not present

## 2017-07-03 DIAGNOSIS — Z471 Aftercare following joint replacement surgery: Secondary | ICD-10-CM | POA: Diagnosis not present

## 2017-07-03 HISTORY — PX: TOTAL HIP ARTHROPLASTY: SHX124

## 2017-07-03 SURGERY — ARTHROPLASTY, HIP, TOTAL, ANTERIOR APPROACH
Anesthesia: Spinal | Site: Hip | Laterality: Right

## 2017-07-03 MED ORDER — CEFAZOLIN SODIUM-DEXTROSE 2-4 GM/100ML-% IV SOLN
2.0000 g | Freq: Four times a day (QID) | INTRAVENOUS | Status: AC
  Administered 2017-07-03 – 2017-07-04 (×2): 2 g via INTRAVENOUS
  Filled 2017-07-03 (×2): qty 100

## 2017-07-03 MED ORDER — PROPOFOL 10 MG/ML IV BOLUS
INTRAVENOUS | Status: DC | PRN
  Administered 2017-07-03 (×2): 30 mg via INTRAVENOUS

## 2017-07-03 MED ORDER — HYDROCODONE-ACETAMINOPHEN 7.5-325 MG PO TABS: 1.0000 | ORAL_TABLET | ORAL | Status: DC | PRN

## 2017-07-03 MED ORDER — HYDROCODONE-ACETAMINOPHEN 7.5-325 MG PO TABS: 1.0000 | ORAL_TABLET | ORAL | 0 refills | Status: DC | PRN

## 2017-07-03 MED ORDER — FENTANYL CITRATE (PF) 100 MCG/2ML IJ SOLN
25.0000 ug | INTRAMUSCULAR | Status: DC | PRN
Start: 2017-07-03 — End: 2017-07-03

## 2017-07-03 MED ORDER — MENTHOL 3 MG MT LOZG: 1.0000 | LOZENGE | OROMUCOSAL | Status: DC | PRN

## 2017-07-03 MED ORDER — FENTANYL CITRATE (PF) 100 MCG/2ML IJ SOLN
INTRAMUSCULAR | Status: DC | PRN
  Administered 2017-07-03: 100 ug via INTRAVENOUS

## 2017-07-03 MED ORDER — MORPHINE SULFATE (PF) 2 MG/ML IV SOLN: 0.5000 mg | INTRAVENOUS | Status: DC | PRN

## 2017-07-03 MED ORDER — PROPOFOL 10 MG/ML IV BOLUS
INTRAVENOUS | Status: AC
  Filled 2017-07-03: qty 20

## 2017-07-03 MED ORDER — PANTOPRAZOLE SODIUM 40 MG PO TBEC
40.0000 mg | DELAYED_RELEASE_TABLET | Freq: Every day | ORAL | Status: DC | PRN
Start: 2017-07-03 — End: 2017-07-04

## 2017-07-03 MED ORDER — BISACODYL 10 MG RE SUPP: 10.0000 mg | Freq: Every day | RECTAL | Status: DC | PRN

## 2017-07-03 MED ORDER — METOCLOPRAMIDE HCL 5 MG/ML IJ SOLN: 5.0000 mg | Freq: Three times a day (TID) | INTRAMUSCULAR | Status: DC | PRN

## 2017-07-03 MED ORDER — ONDANSETRON HCL 4 MG/2ML IJ SOLN
INTRAMUSCULAR | Status: DC | PRN
  Administered 2017-07-03: 4 mg via INTRAVENOUS

## 2017-07-03 MED ORDER — DOXEPIN HCL 10 MG/ML PO CONC
3.0000 mg | Freq: Every day | ORAL | Status: DC
  Administered 2017-07-03: 3 mg via ORAL
  Filled 2017-07-03: qty 0.3

## 2017-07-03 MED ORDER — DOCUSATE SODIUM 100 MG PO CAPS
100.0000 mg | ORAL_CAPSULE | Freq: Two times a day (BID) | ORAL | Status: DC
  Administered 2017-07-03 – 2017-07-04 (×2): 100 mg via ORAL
  Filled 2017-07-03 (×2): qty 1

## 2017-07-03 MED ORDER — TRANEXAMIC ACID 1000 MG/10ML IV SOLN
1000.0000 mg | Freq: Once | INTRAVENOUS | Status: AC
  Administered 2017-07-03: 1000 mg via INTRAVENOUS
  Filled 2017-07-03: qty 1100

## 2017-07-03 MED ORDER — DOCUSATE SODIUM 100 MG PO CAPS: 100.0000 mg | ORAL_CAPSULE | Freq: Two times a day (BID) | ORAL | 0 refills | Status: DC

## 2017-07-03 MED ORDER — VALACYCLOVIR HCL 500 MG PO TABS
1000.0000 mg | ORAL_TABLET | Freq: Every day | ORAL | Status: DC
  Administered 2017-07-04: 1000 mg via ORAL
  Filled 2017-07-03: qty 2

## 2017-07-03 MED ORDER — DEXAMETHASONE SODIUM PHOSPHATE 10 MG/ML IJ SOLN
10.0000 mg | Freq: Once | INTRAMUSCULAR | Status: AC
  Administered 2017-07-04: 10 mg via INTRAVENOUS
  Filled 2017-07-03: qty 1

## 2017-07-03 MED ORDER — CELECOXIB 200 MG PO CAPS
200.0000 mg | ORAL_CAPSULE | Freq: Two times a day (BID) | ORAL | Status: DC
  Administered 2017-07-04: 200 mg via ORAL
  Filled 2017-07-03 (×2): qty 1

## 2017-07-03 MED ORDER — FENTANYL CITRATE (PF) 100 MCG/2ML IJ SOLN
INTRAMUSCULAR | Status: AC
  Filled 2017-07-03: qty 2

## 2017-07-03 MED ORDER — PHENOL 1.4 % MT LIQD: 1.0000 | OROMUCOSAL | Status: DC | PRN

## 2017-07-03 MED ORDER — 0.9 % SODIUM CHLORIDE (POUR BTL) OPTIME
TOPICAL | Status: DC | PRN
  Administered 2017-07-03: 1000 mL

## 2017-07-03 MED ORDER — SODIUM CHLORIDE 0.9 % IV SOLN
INTRAVENOUS | Status: DC
  Administered 2017-07-03: 18:00:00 via INTRAVENOUS

## 2017-07-03 MED ORDER — DEXAMETHASONE SODIUM PHOSPHATE 10 MG/ML IJ SOLN
10.0000 mg | Freq: Once | INTRAMUSCULAR | Status: AC
  Administered 2017-07-03: 10 mg via INTRAVENOUS

## 2017-07-03 MED ORDER — METHOCARBAMOL 500 MG PO TABS: 500.0000 mg | ORAL_TABLET | Freq: Four times a day (QID) | ORAL | 0 refills | Status: DC | PRN

## 2017-07-03 MED ORDER — ONDANSETRON HCL 4 MG/2ML IJ SOLN: 4.0000 mg | Freq: Once | INTRAMUSCULAR | Status: DC | PRN

## 2017-07-03 MED ORDER — PROPOFOL 10 MG/ML IV BOLUS
INTRAVENOUS | Status: AC
  Filled 2017-07-03: qty 60

## 2017-07-03 MED ORDER — DIPHENHYDRAMINE HCL 12.5 MG/5ML PO ELIX: 12.5000 mg | ORAL_SOLUTION | ORAL | Status: DC | PRN

## 2017-07-03 MED ORDER — ASPIRIN 81 MG PO CHEW
81.0000 mg | CHEWABLE_TABLET | Freq: Two times a day (BID) | ORAL | Status: DC
  Administered 2017-07-03 – 2017-07-04 (×2): 81 mg via ORAL
  Filled 2017-07-03: qty 1

## 2017-07-03 MED ORDER — METOCLOPRAMIDE HCL 5 MG PO TABS: 5.0000 mg | ORAL_TABLET | Freq: Three times a day (TID) | ORAL | Status: DC | PRN

## 2017-07-03 MED ORDER — ASPIRIN 81 MG PO CHEW: 81.0000 mg | CHEWABLE_TABLET | Freq: Two times a day (BID) | ORAL | 0 refills | Status: AC

## 2017-07-03 MED ORDER — HYDROCODONE-ACETAMINOPHEN 5-325 MG PO TABS
1.0000 | ORAL_TABLET | ORAL | Status: DC | PRN
  Administered 2017-07-03 – 2017-07-04 (×4): 1 via ORAL
  Administered 2017-07-04: 2 via ORAL
  Administered 2017-07-04: 1 via ORAL
  Filled 2017-07-03: qty 2
  Filled 2017-07-03 (×2): qty 1
  Filled 2017-07-03: qty 2
  Filled 2017-07-03 (×2): qty 1

## 2017-07-03 MED ORDER — MIDAZOLAM HCL 5 MG/5ML IJ SOLN
INTRAMUSCULAR | Status: DC | PRN
  Administered 2017-07-03: 2 mg via INTRAVENOUS

## 2017-07-03 MED ORDER — FERROUS SULFATE 325 (65 FE) MG PO TABS
325.0000 mg | ORAL_TABLET | Freq: Three times a day (TID) | ORAL | Status: DC
  Administered 2017-07-04: 325 mg via ORAL
  Filled 2017-07-03: qty 1

## 2017-07-03 MED ORDER — CEFAZOLIN SODIUM-DEXTROSE 2-4 GM/100ML-% IV SOLN
2.0000 g | INTRAVENOUS | Status: AC
  Administered 2017-07-03: 2 g via INTRAVENOUS
  Filled 2017-07-03: qty 100

## 2017-07-03 MED ORDER — FERROUS SULFATE 325 (65 FE) MG PO TABS: 325.0000 mg | ORAL_TABLET | Freq: Three times a day (TID) | ORAL | 3 refills | Status: DC

## 2017-07-03 MED ORDER — MIDAZOLAM HCL 2 MG/2ML IJ SOLN
INTRAMUSCULAR | Status: AC
  Filled 2017-07-03: qty 2

## 2017-07-03 MED ORDER — ONDANSETRON HCL 4 MG/2ML IJ SOLN
INTRAMUSCULAR | Status: AC
  Filled 2017-07-03: qty 2

## 2017-07-03 MED ORDER — PROPOFOL 500 MG/50ML IV EMUL
INTRAVENOUS | Status: DC | PRN
  Administered 2017-07-03: 100 ug/kg/min via INTRAVENOUS

## 2017-07-03 MED ORDER — ALUM & MAG HYDROXIDE-SIMETH 200-200-20 MG/5ML PO SUSP: 15.0000 mL | ORAL | Status: DC | PRN

## 2017-07-03 MED ORDER — DOXEPIN HCL 3 MG PO TABS: 1.0000 | ORAL_TABLET | Freq: Every day | ORAL | Status: DC

## 2017-07-03 MED ORDER — POLYETHYLENE GLYCOL 3350 17 G PO PACK: 17.0000 g | PACK | Freq: Two times a day (BID) | ORAL | 0 refills | Status: DC

## 2017-07-03 MED ORDER — ONDANSETRON HCL 4 MG/2ML IJ SOLN: 4.0000 mg | Freq: Four times a day (QID) | INTRAMUSCULAR | Status: DC | PRN

## 2017-07-03 MED ORDER — ACETAMINOPHEN 325 MG PO TABS: 325.0000 mg | ORAL_TABLET | Freq: Four times a day (QID) | ORAL | Status: DC | PRN

## 2017-07-03 MED ORDER — ONDANSETRON HCL 4 MG PO TABS: 4.0000 mg | ORAL_TABLET | Freq: Four times a day (QID) | ORAL | Status: DC | PRN

## 2017-07-03 MED ORDER — CHLORHEXIDINE GLUCONATE 4 % EX LIQD: 60.0000 mL | Freq: Once | CUTANEOUS | Status: DC

## 2017-07-03 MED ORDER — METHOCARBAMOL 500 MG PO TABS
500.0000 mg | ORAL_TABLET | Freq: Four times a day (QID) | ORAL | Status: DC | PRN
  Administered 2017-07-04 (×2): 500 mg via ORAL
  Filled 2017-07-03 (×2): qty 1

## 2017-07-03 MED ORDER — MAGNESIUM CITRATE PO SOLN: 1.0000 | Freq: Once | ORAL | Status: DC | PRN

## 2017-07-03 MED ORDER — DEXAMETHASONE SODIUM PHOSPHATE 10 MG/ML IJ SOLN
INTRAMUSCULAR | Status: AC
  Filled 2017-07-03: qty 1

## 2017-07-03 MED ORDER — LACTATED RINGERS IV SOLN
INTRAVENOUS | Status: DC
  Administered 2017-07-03 (×3): via INTRAVENOUS

## 2017-07-03 MED ORDER — POLYETHYLENE GLYCOL 3350 17 G PO PACK
17.0000 g | PACK | Freq: Two times a day (BID) | ORAL | Status: DC
  Administered 2017-07-03 – 2017-07-04 (×2): 17 g via ORAL
  Filled 2017-07-03 (×2): qty 1

## 2017-07-03 MED ORDER — METHOCARBAMOL 1000 MG/10ML IJ SOLN
500.0000 mg | Freq: Four times a day (QID) | INTRAVENOUS | Status: DC | PRN
  Administered 2017-07-03: 500 mg via INTRAVENOUS
  Filled 2017-07-03: qty 550

## 2017-07-03 MED ORDER — LORATADINE 10 MG PO TABS: 10.0000 mg | ORAL_TABLET | Freq: Every day | ORAL | Status: DC | PRN

## 2017-07-03 SURGICAL SUPPLY — 35 items
BAG DECANTER FOR FLEXI CONT (MISCELLANEOUS) IMPLANT
BAG ZIPLOCK 12X15 (MISCELLANEOUS) IMPLANT
BLADE SAG 18X100X1.27 (BLADE) ×2 IMPLANT
CAPT HIP TOTAL 2 ×2 IMPLANT
CLOTH BEACON ORANGE TIMEOUT ST (SAFETY) ×2 IMPLANT
COVER PERINEAL POST (MISCELLANEOUS) ×2 IMPLANT
COVER SURGICAL LIGHT HANDLE (MISCELLANEOUS) ×2 IMPLANT
DERMABOND ADVANCED (GAUZE/BANDAGES/DRESSINGS) ×1
DERMABOND ADVANCED .7 DNX12 (GAUZE/BANDAGES/DRESSINGS) ×1 IMPLANT
DRAPE STERI IOBAN 125X83 (DRAPES) ×2 IMPLANT
DRAPE U-SHAPE 47X51 STRL (DRAPES) ×4 IMPLANT
DRESSING AQUACEL AG SP 3.5X10 (GAUZE/BANDAGES/DRESSINGS) ×1 IMPLANT
DRSG AQUACEL AG SP 3.5X10 (GAUZE/BANDAGES/DRESSINGS) ×2
DURAPREP 26ML APPLICATOR (WOUND CARE) ×2 IMPLANT
ELECT REM PT RETURN 15FT ADLT (MISCELLANEOUS) ×2 IMPLANT
GLOVE BIOGEL M STRL SZ7.5 (GLOVE) IMPLANT
GLOVE BIOGEL PI IND STRL 7.5 (GLOVE) ×1 IMPLANT
GLOVE BIOGEL PI IND STRL 8.5 (GLOVE) ×1 IMPLANT
GLOVE BIOGEL PI INDICATOR 7.5 (GLOVE) ×1
GLOVE BIOGEL PI INDICATOR 8.5 (GLOVE) ×1
GLOVE ECLIPSE 8.0 STRL XLNG CF (GLOVE) ×4 IMPLANT
GLOVE ORTHO TXT STRL SZ7.5 (GLOVE) ×2 IMPLANT
GOWN STRL REUS W/TWL 2XL LVL3 (GOWN DISPOSABLE) ×2 IMPLANT
GOWN STRL REUS W/TWL LRG LVL3 (GOWN DISPOSABLE) ×2 IMPLANT
HOLDER FOLEY CATH W/STRAP (MISCELLANEOUS) ×2 IMPLANT
PACK ANTERIOR HIP CUSTOM (KITS) ×2 IMPLANT
SUT MNCRL AB 4-0 PS2 18 (SUTURE) ×2 IMPLANT
SUT STRATAFIX 0 PDS 27 VIOLET (SUTURE) ×2
SUT VIC AB 1 CT1 36 (SUTURE) ×8 IMPLANT
SUT VIC AB 2-0 CT1 27 (SUTURE) ×2
SUT VIC AB 2-0 CT1 TAPERPNT 27 (SUTURE) ×2 IMPLANT
SUTURE STRATFX 0 PDS 27 VIOLET (SUTURE) ×1 IMPLANT
TRAY FOLEY W/METER SILVER 16FR (SET/KITS/TRAYS/PACK) IMPLANT
WATER STERILE IRR 1000ML POUR (IV SOLUTION) ×2 IMPLANT
YANKAUER SUCT BULB TIP 10FT TU (MISCELLANEOUS) IMPLANT

## 2017-07-03 NOTE — Transfer of Care (Signed)
Immediate Anesthesia Transfer of Care Note  Patient: Sara Figueroa  Procedure(s) Performed: RIGHT TOTAL HIP ARTHROPLASTY ANTERIOR APPROACH (Right Hip)  Patient Location: PACU  Anesthesia Type:Spinal  Level of Consciousness: sedated  Airway & Oxygen Therapy: Patient Spontanous Breathing and Patient connected to face mask oxygen  Post-op Assessment: Report given to RN and Post -op Vital signs reviewed and stable  Post vital signs: Reviewed and stable  Last Vitals:  Vitals Value Taken Time  BP    Temp    Pulse    Resp    SpO2      Last Pain:  Vitals:   07/03/17 0944  TempSrc:   PainSc: 0-No pain         Complications: No apparent anesthesia complications

## 2017-07-03 NOTE — Anesthesia Procedure Notes (Signed)
Procedure Name: MAC Date/Time: 07/03/2017 11:08 AM Performed by: Lissa Morales, CRNA Pre-anesthesia Checklist: Emergency Drugs available, Suction available, Patient identified, Patient being monitored and Timeout performed Patient Re-evaluated:Patient Re-evaluated prior to induction Oxygen Delivery Method: Simple face mask Placement Confirmation: positive ETCO2

## 2017-07-03 NOTE — Anesthesia Preprocedure Evaluation (Addendum)
Anesthesia Evaluation  Patient identified by MRN, date of birth, ID band Patient awake    Reviewed: Allergy & Precautions, NPO status , Patient's Chart, lab work & pertinent test results  Airway Mallampati: II  TM Distance: >3 FB Neck ROM: Full    Dental no notable dental hx.    Pulmonary former smoker,    Pulmonary exam normal breath sounds clear to auscultation       Cardiovascular negative cardio ROS Normal cardiovascular exam Rhythm:Regular Rate:Normal  ECG: SR, rate 69   Neuro/Psych negative neurological ROS  negative psych ROS   GI/Hepatic negative GI ROS, Neg liver ROS,   Endo/Other  negative endocrine ROS  Renal/GU negative Renal ROS     Musculoskeletal  (+) Arthritis , Osteoarthritis,    Abdominal   Peds  Hematology negative hematology ROS (+)   Anesthesia Other Findings Right hip osteoarthrits  Reproductive/Obstetrics                            Anesthesia Physical Anesthesia Plan  ASA: II  Anesthesia Plan: Spinal   Post-op Pain Management:    Induction: Intravenous  PONV Risk Score and Plan: 2 and Ondansetron and Treatment may vary due to age or medical condition  Airway Management Planned: Natural Airway  Additional Equipment:   Intra-op Plan:   Post-operative Plan:   Informed Consent: I have reviewed the patients History and Physical, chart, labs and discussed the procedure including the risks, benefits and alternatives for the proposed anesthesia with the patient or authorized representative who has indicated his/her understanding and acceptance.   Dental advisory given  Plan Discussed with: CRNA  Anesthesia Plan Comments:         Anesthesia Quick Evaluation

## 2017-07-03 NOTE — Evaluation (Signed)
Physical Therapy Evaluation Patient Details Name: Sara Figueroa MRN: [id number] DOB: October 10, 1960 Today's Date: 07/03/2017   History of Present Illness  57 yo female s/p R THA-direct anterior 07/03/17  Clinical Impression  On eval POD 0, pt required Min assist for mobility. She walked ~65 feet with a RW. Pain rated 5/10 with activity. Will follow and progress activity as tolerated. Per chart, plan is for pt to d/c home with a HEP.     Follow Up Recommendations Follow surgeon's recommendation for DC plan and follow-up therapies    Equipment Recommendations  None recommended by PT    Recommendations for Other Services       Precautions / Restrictions Precautions Precautions: Fall Restrictions Weight Bearing Restrictions: No Other Position/Activity Restrictions: WBAT      Mobility  Bed Mobility Overal bed mobility: Needs Assistance Bed Mobility: Supine to Sit     Supine to sit: Min assist     General bed mobility comments: Assist for R LE. Increased time.   Transfers Overall transfer level: Needs assistance Equipment used: Rolling walker (2 wheeled) Transfers: Sit to/from Stand Sit to Stand: Min guard         General transfer comment: close guard for safety. VCs safety, technique, hand placement  Ambulation/Gait Ambulation/Gait assistance: Min guard Ambulation Distance (Feet): 65 Feet Assistive device: Rolling walker (2 wheeled) Gait Pattern/deviations: Step-to pattern;Antalgic     General Gait Details: VC safety, sequence. slow gait speed. Followed with recliner.   Stairs            Wheelchair Mobility    Modified Rankin (Stroke Patients Only)       Balance                                             Pertinent Vitals/Pain Pain Assessment: 0-10 Pain Score: 5  Pain Location: R hip Pain Descriptors / Indicators: Aching;Sore Pain Intervention(s): Monitored during session;Repositioned    Home Living Family/patient expects  to be discharged to:: Private residence Living Arrangements: Spouse/significant other   Type of Home: House Home Access: Stairs to enter Entrance Stairs-Rails: Left Entrance Stairs-Number of Steps: 4-5 Home Layout: Able to live on main level with bedroom/bathroom Home Equipment: Walker - 2 wheels;Cane - single point      Prior Function Level of Independence: Independent               Hand Dominance        Extremity/Trunk Assessment   Upper Extremity Assessment Upper Extremity Assessment: Overall WFL for tasks assessed    Lower Extremity Assessment Lower Extremity Assessment: Generalized weakness(s/p R THA)    Cervical / Trunk Assessment Cervical / Trunk Assessment: Normal  Communication   Communication: No difficulties  Cognition Arousal/Alertness: Awake/alert Behavior During Therapy: WFL for tasks assessed/performed Overall Cognitive Status: Within Functional Limits for tasks assessed                                        General Comments      Exercises     Assessment/Plan    PT Assessment Patient needs continued PT services  PT Problem List Decreased strength;Decreased range of motion;Decreased mobility;Decreased activity tolerance;Pain;Decreased balance;Decreased knowledge of use of DME       PT Treatment Interventions DME instruction;Gait training;Functional mobility  training;Therapeutic activities;Balance training;Patient/family education;Therapeutic exercise;Stair training    PT Goals (Current goals can be found in the Care Plan section)  Acute Rehab PT Goals Patient Stated Goal: regain independence PT Goal Formulation: With patient/family Time For Goal Achievement: 07/17/17 Potential to Achieve Goals: Good    Frequency 7X/week   Barriers to discharge        Co-evaluation               AM-PAC PT "6 Clicks" Daily Activity  Outcome Measure Difficulty turning over in bed (including adjusting bedclothes, sheets and  blankets)?: Unable Difficulty moving from lying on back to sitting on the side of the bed? : Unable Difficulty sitting down on and standing up from a chair with arms (e.g., wheelchair, bedside commode, etc,.)?: A Little Help needed moving to and from a bed to chair (including a wheelchair)?: A Little Help needed walking in hospital room?: A Little Help needed climbing 3-5 steps with a railing? : A Little 6 Click Score: 14    End of Session Equipment Utilized During Treatment: Gait belt Activity Tolerance: Patient tolerated treatment well Patient left: in chair;with call bell/phone within reach;with family/visitor present   PT Visit Diagnosis: Muscle weakness (generalized) (M62.81);Difficulty in walking, not elsewhere classified (R26.2);Pain Pain - Right/Left: Right Pain - part of body: Hip    Time: 2703-5009 PT Time Calculation (min) (ACUTE ONLY): 13 min   Charges:   PT Evaluation $PT Eval Low Complexity: 1 Low     PT G Codes:          Weston Anna, MPT Pager: [phone]

## 2017-07-03 NOTE — Anesthesia Postprocedure Evaluation (Signed)
Anesthesia Post Note  Patient: Sara Figueroa  Procedure(s) Performed: RIGHT TOTAL HIP ARTHROPLASTY ANTERIOR APPROACH (Right Hip)     Patient location during evaluation: PACU Anesthesia Type: Spinal Level of consciousness: oriented and awake and alert Pain management: pain level controlled Vital Signs Assessment: post-procedure vital signs reviewed and stable Respiratory status: spontaneous breathing, respiratory function stable and patient connected to nasal cannula oxygen Cardiovascular status: blood pressure returned to baseline and stable Postop Assessment: no headache, no backache, no apparent nausea or vomiting and spinal receding Anesthetic complications: no    Last Vitals:  Vitals:   07/03/17 1427 07/03/17 1530  BP: (!) 161/79 (!) 147/86  Pulse: (!) 45 78  Resp: 16 17  Temp: 36.7 C 36.8 C  SpO2: 100% 99%    Last Pain:  Vitals:   07/03/17 1530  TempSrc: Oral  PainSc:                  Maui Britten P Conn Trombetta

## 2017-07-03 NOTE — Op Note (Signed)
NAME:  Sara Figueroa NO.: [id number]      MEDICAL RECORD NO.: [id number]      FACILITY:  [facility]      PHYSICIAN:  Mauri Pole  DATE OF BIRTH:  1960-10-11     DATE OF PROCEDURE:  07/03/2017                                 OPERATIVE REPORT         PREOPERATIVE DIAGNOSIS: Right  hip osteoarthritis.      POSTOPERATIVE DIAGNOSIS:  Right hip osteoarthritis.      PROCEDURE:  Right total hip replacement with auto graft bone graft through an anterior approach   utilizing DePuy THR system, component size 42mm pinnacle cup, a size 36+4 neutral   Altrex liner, a size 3 Hi Actis stem with a 36+5 delta ceramic   ball.      SURGEON:  Pietro Cassis. Alvan Dame, M.D.      ASSISTANT:  Danae Orleans, PA-C     ANESTHESIA:  Spinal.      SPECIMENS:  None.      COMPLICATIONS:  None.      BLOOD LOSS:  650 cc     DRAINS:  None.      INDICATION OF THE PROCEDURE:  Sara Figueroa is a 57 y.o. female who had   presented to office for evaluation of right hip pain.  Radiographs revealed   progressive degenerative changes with bone-on-bone   articulation to the  hip joint.  The patient had painful limited range of   motion significantly affecting their overall quality of life.  The patient was failing to    respond to conservative measures, and at this point was ready   to proceed with more definitive measures.  The patient has noted progressive   degenerative changes in his hip, progressive problems and dysfunction   with regarding the hip prior to surgery.  Consent was obtained for   benefit of pain relief.  Specific risk of infection, DVT, component   failure, dislocation, need for revision surgery, as well discussion of   the anterior versus posterior approach were reviewed.  Consent was   obtained for benefit of anterior pain relief through an anterior   approach.      PROCEDURE IN DETAIL:  The patient was brought to operative theater.   Once  adequate anesthesia, preoperative antibiotics, 2 gm of Ancef, 1 gm of Tranexamic Acid, and 10 mg of Decadron administered.   The patient was positioned supine on the OSI Hanna table.  Once adequate   padding of boney process was carried out, we had predraped out the hip, and  used fluoroscopy to confirm orientation of the pelvis and position.      The right hip was then prepped and draped from proximal iliac crest to   mid thigh with shower curtain technique.      Time-out was performed identifying the patient, planned procedure, and   extremity.     An incision was then made 2 cm distal and lateral to the   anterior superior iliac spine extending over the orientation of the   tensor fascia lata muscle and sharp dissection was carried down to the   fascia of the muscle and protractor placed in the soft tissues.  The fascia was then incised.  The muscle belly was identified and swept   laterally and retractor placed along the superior neck.  Following   cauterization of the circumflex vessels and removing some pericapsular   fat, a second cobra retractor was placed on the inferior neck.  A third   retractor was placed on the anterior acetabulum after elevating the   anterior rectus.  A L-capsulotomy was along the line of the   superior neck to the trochanteric fossa, then extended proximally and   distally.  Tag sutures were placed and the retractors were then placed   intracapsular.  We then identified the trochanteric fossa and   orientation of my neck cut, confirmed this radiographically   and then made a neck osteotomy with the femur on traction.  The femoral   head was removed without difficulty or complication.  Traction was let   off and retractors were placed posterior and anterior around the   acetabulum.      The labrum and foveal tissue were debrided.  I began reaming with a 72mm   reamer to the medial wall which was medial to Kohler's line.  I then reamed up to 53  reamer with good bony bed preparation and the 28mm cup was chosen.  I used the 43 reamer and reamed her native femoral for bone to use as graft medially within acetabulum. The final 75mm Pinnacle cup was then impacted under fluoroscopy  to confirm the depth of penetration and orientation with respect to   abduction.  A screw was placed followed by the hole eliminator.  The final   36+4 Altrex liner was impacted with good visualized rim fit.  The cup was positioned anatomically within the acetabular portion of the pelvis.      At this point, the femur was rolled at 80 degrees.  Further capsule was   released off the inferior aspect of the femoral neck.  I then   released the superior capsule proximally.  The hook was placed laterally   along the femur and elevated manually and held in position with the bed   hook.  The leg was then extended and adducted with the leg rolled to 100   degrees of external rotation.  Once the proximal femur was fully   exposed, I used a box osteotome to set orientation.  I then began   broaching with the starting chili pepper broach and passed this by hand and then broached up to 3.  With the 3 broach in place I chose a high offset neck and did several trial reductions.  The offset and leg lengths appeared to be best matched with the +5 head ball confirmed radiographically.   Given these findings, I went ahead and dislocated the hip, repositioned all   retractors and positioned the right hip in the extended and abducted position.  The final 3 Hi Actis stem was   chosen and it was impacted down to the level of neck cut.  Based on this   and the trial reduction, a 36+5 delta ceramic ball was chosen and   impacted onto a clean and dry trunnion, and the hip was reduced.  The   hip had been irrigated throughout the case again at this point.  I did   reapproximate the superior capsular leaflet to the anterior leaflet   using #1 Vicryl.  The fascia of the   tensor fascia  lata muscle was then reapproximated using #1 Vicryl and #  0 Stratafix sutures.  The   remaining wound was closed with 2-0 Vicryl and running 4-0 Monocryl.   The hip was cleaned, dried, and dressed sterilely using Dermabond and   Aquacel dressing.  She was then brought   to recovery room in stable condition tolerating the procedure well.    Danae Orleans, PA-C was present for the entirety of the case involved from   preoperative positioning, perioperative retractor management, general   facilitation of the case, as well as primary wound closure as assistant.            Pietro Cassis Alvan Dame, M.D.        07/03/2017 12:59 PM

## 2017-07-03 NOTE — Discharge Instructions (Signed)

## 2017-07-03 NOTE — Interval H&P Note (Signed)
History and Physical Interval Note:  07/03/2017 10:06 AM  Sara Figueroa  has presented today for surgery, with the diagnosis of Right hip osteoarthrits  The various methods of treatment have been discussed with the patient and family. After consideration of risks, benefits and other options for treatment, the patient has consented to  Procedure(s) with comments: RIGHT TOTAL HIP ARTHROPLASTY ANTERIOR APPROACH (Right) - 70 mins as a surgical intervention .  The patient's history has been reviewed, patient examined, no change in status, stable for surgery.  I have reviewed the patient's chart and labs.  Questions were answered to the patient's satisfaction.     Mauri Pole

## 2017-07-03 NOTE — Anesthesia Procedure Notes (Signed)
Spinal  Patient location during procedure: OR End time: 07/03/2017 11:17 AM Staffing Resident/CRNA: Evans, Janet E, CRNA Performed: resident/CRNA  Preanesthetic Checklist Completed: patient identified, site marked, surgical consent, pre-op evaluation, timeout performed, IV checked, risks and benefits discussed and monitors and equipment checked Spinal Block Patient position: sitting Prep: DuraPrep Patient monitoring: heart rate, continuous pulse ox and blood pressure Approach: midline Location: L4-5 Injection technique: single-shot Needle Needle type: Pencan  Needle gauge: 24 G Needle length: 9 cm Assessment Sensory level: T6 Additional Notes Expiration date of kit checked and confirmed. Patient tolerated procedure well, without complications.       

## 2017-07-04 DIAGNOSIS — E663 Overweight: Secondary | ICD-10-CM | POA: Diagnosis present

## 2017-07-04 LAB — CBC
HCT: 31.5 % — ABNORMAL LOW (ref 36.0–46.0)
Hemoglobin: 10.2 g/dL — ABNORMAL LOW (ref 12.0–15.0)
MCH: 30.4 pg (ref 26.0–34.0)
MCHC: 32.4 g/dL (ref 30.0–36.0)
MCV: 94 fL (ref 78.0–100.0)
Platelets: 237 10*3/uL (ref 150–400)
RBC: 3.35 MIL/uL — ABNORMAL LOW (ref 3.87–5.11)
RDW: 12.7 % (ref 11.5–15.5)
WBC: 9.8 10*3/uL (ref 4.0–10.5)

## 2017-07-04 LAB — BASIC METABOLIC PANEL
Anion gap: 9 (ref 5–15)
BUN: 13 mg/dL (ref 6–20)
CO2: 25 mmol/L (ref 22–32)
CREATININE: 0.69 mg/dL (ref 0.44–1.00)
Calcium: 8.9 mg/dL (ref 8.9–10.3)
Chloride: 108 mmol/L (ref 101–111)
GFR calc Af Amer: >60 mL/min (ref >60)
GFR calc non Af Amer: >60 mL/min (ref >60)
Glucose, Bld: 136 mg/dL — ABNORMAL HIGH (ref 65–99)
Potassium: 4.1 mmol/L (ref 3.5–5.1)
SODIUM: 142 mmol/L (ref 135–145)

## 2017-07-04 NOTE — Progress Notes (Signed)
Physical Therapy Treatment Patient Details Name: Sara Figueroa MRN: [id number] DOB: 02-24-1961 Today's Date: 07/04/2017    History of Present Illness 57 yo female s/p R THA-direct anterior 07/03/17    PT Comments    Pt walked ~50 feet. She c/o increased pain and some lightheadedness. Pt requested to sit down. Assessed BP: 110/85. Transported pt back to room in the recliner. Will plan to have a 2nd session later prior to d/c on today.     Follow Up Recommendations  Follow surgeon's recommendation for DC plan and follow-up therapies     Equipment Recommendations  None recommended by PT    Recommendations for Other Services       Precautions / Restrictions Precautions Precautions: Fall Restrictions Weight Bearing Restrictions: No Other Position/Activity Restrictions: WBAT    Mobility  Bed Mobility               General bed mobility comments: oob in recliner  Transfers Overall transfer level: Needs assistance Equipment used: Rolling walker (2 wheeled) Transfers: Sit to/from Stand Sit to Stand: Min guard         General transfer comment: close guard for safety. VCs safety, technique, hand placement  Ambulation/Gait Ambulation/Gait assistance: Min guard Ambulation Distance (Feet): 50 Feet Assistive device: Rolling walker (2 wheeled) Gait Pattern/deviations: Step-to pattern;Antalgic;Decreased step length - right;Decreased step length - left     General Gait Details: VC safety, sequence. slow gait speed. Pt c/o increased pain, some lightheadedness. Pt requested to sit down. BP 110/85. Transported back to room in the Dentist    Modified Rankin (Stroke Patients Only)       Balance                                            Cognition Arousal/Alertness: Awake/alert Behavior During Therapy: WFL for tasks assessed/performed Overall Cognitive Status: Within Functional Limits for tasks  assessed                                        Exercises      General Comments        Pertinent Vitals/Pain Pain Assessment: 0-10 Pain Score: 8  Pain Location: R hip Pain Descriptors / Indicators: Aching;Sore Pain Intervention(s): Limited activity within patient's tolerance;Repositioned    Home Living                      Prior Function            PT Goals (current goals can now be found in the care plan section) Progress towards PT goals: Progressing toward goals    Frequency    7X/week      PT Plan Current plan remains appropriate    Co-evaluation              AM-PAC PT "6 Clicks" Daily Activity  Outcome Measure  Difficulty turning over in bed (including adjusting bedclothes, sheets and blankets)?: Unable Difficulty moving from lying on back to sitting on the side of the bed? : Unable Difficulty sitting down on and standing up from a chair with arms (e.g., wheelchair, bedside commode, etc,.)?: Unable Help needed moving to and from a bed  to chair (including a wheelchair)?: A Little Help needed walking in hospital room?: A Little Help needed climbing 3-5 steps with a railing? : A Little 6 Click Score: 12    End of Session Equipment Utilized During Treatment: Gait belt Activity Tolerance: Patient limited by pain Patient left: in chair;with call bell/phone within reach   PT Visit Diagnosis: Muscle weakness (generalized) (M62.81);Difficulty in walking, not elsewhere classified (R26.2);Pain Pain - Right/Left: Right Pain - part of body: Hip     Time: 0921-0933 PT Time Calculation (min) (ACUTE ONLY): 12 min  Charges:  $Gait Training: 8-22 mins                    G Codes:          Weston Anna, MPT Pager: [phone]

## 2017-07-04 NOTE — Progress Notes (Signed)
     Subjective: 1 Day Post-Op Procedure(s) (LRB): RIGHT TOTAL HIP ARTHROPLASTY ANTERIOR APPROACH (Right)   Patient reports pain as mild, pain controlled. No events throughout the night.  Discussed her activities levels over the next 6 weeks.  Transitioning from a walker to cane to nothing. Ready to be discharged home.   Objective:   VITALS:   Vitals:   07/04/17 0023 07/04/17 0416  BP: 137/85 133/74  Pulse: 70 76  Resp: 15 16  Temp: 97.8 F (36.6 C) 99 F (37.2 C)  SpO2: 98% 97%    Dorsiflexion/Plantar flexion intact Incision: dressing C/D/I No cellulitis present Compartment soft  LABS Recent Labs    07/04/17 0603  HGB 10.2*  HCT 31.5*  WBC 9.8  PLT 237    Recent Labs    07/04/17 0603  NA 142  K 4.1  BUN 13  CREATININE 0.69  GLUCOSE 136*     Assessment/Plan: 1 Day Post-Op Procedure(s) (LRB): RIGHT TOTAL HIP ARTHROPLASTY ANTERIOR APPROACH (Right) Foley cath d/c'ed Advance diet Up with therapy D/C IV fluids Discharge home Follow up in 2 weeks at [facility]. Follow up with OLIN,Matika Bartell D in 2 weeks.  Contact information:  [facility] [address] [place] [phone]    Overweight (BMI 25-29.9) Estimated body mass index is 28.53 kg/m as calculated from the following:   Height as of this encounter: 5' 4.5" (1.638 m).   Weight as of this encounter: 76.6 kg (168 lb 12.8 oz). Patient also counseled that weight may inhibit the healing process Patient counseled that losing weight will help with future health issues      West Pugh. Aleeza [place]   PAC  07/04/2017, 8:35 AM

## 2017-07-04 NOTE — Progress Notes (Signed)
Pt d/c to home.  D/c summary review with pt.  Written rx given to pt.

## 2017-07-04 NOTE — Progress Notes (Signed)
Physical Therapy Treatment Patient Details Name: Sara Figueroa MRN: [id number] DOB: 1960/09/15 Today's Date: 07/04/2017    History of Present Illness 57 yo female s/p R THA-direct anterior 07/03/17    PT Comments    Improved activity tolerance and performance this session. Reviewed exercises, gait training, and stair training. Issued HEP for pt to perform 2x/day. All education completed. Okay to d/c from PT standpoint-made RN aware.    Follow Up Recommendations  Follow surgeon's recommendation for DC plan and follow-up therapies     Equipment Recommendations  None recommended by PT    Recommendations for Other Services       Precautions / Restrictions Precautions Precautions: Fall Restrictions Weight Bearing Restrictions: No Other Position/Activity Restrictions: WBAT    Mobility  Bed Mobility               General bed mobility comments: oob in recliner  Transfers Overall transfer level: Needs assistance Equipment used: Rolling walker (2 wheeled) Transfers: Sit to/from Stand Sit to Stand: Min guard         General transfer comment: close guard for safety. VCs safety, technique, hand placement  Ambulation/Gait Ambulation/Gait assistance: Min guard Ambulation Distance (Feet): 100 Feet Assistive device: Rolling walker (2 wheeled) Gait Pattern/deviations: Step-to pattern;Decreased step length - left;Decreased step length - right     General Gait Details: VC safety, sequence, step length. Slow gait speed. Improved tolerance this session (after pain meds)   Stairs Stairs: Yes Stairs assistance: Min guard Stair Management: One rail Left;Step to pattern;Forwards Number of Stairs: 2 General stair comments: close guard for safety. VCs safety, sequence.    Wheelchair Mobility    Modified Rankin (Stroke Patients Only)       Balance                                            Cognition Arousal/Alertness: Awake/alert Behavior During  Therapy: WFL for tasks assessed/performed Overall Cognitive Status: Within Functional Limits for tasks assessed                                        Exercises Total Joint Exercises Ankle Circles/Pumps: AROM;Both;10 reps;Supine Quad Sets: AROM;Both;10 reps;Supine Heel Slides: AAROM;Right;10 reps;Supine Hip ABduction/ADduction: AAROM;Right;10 reps;Supine Long Arc Quad: Right;10 reps;AROM;Seated Knee Flexion: AROM;Right;10 reps;Standing Marching in Standing: AROM;Both;10 reps;Standing General Exercises - Lower Extremity Heel Raises: AROM;Both;10 reps;Standing    General Comments        Pertinent Vitals/Pain Pain Assessment: 0-10 Pain Score: 8  Pain Location: R hip Pain Descriptors / Indicators: Aching;Sore Pain Intervention(s): Limited activity within patient's tolerance;Repositioned    Home Living                      Prior Function            PT Goals (current goals can now be found in the care plan section) Progress towards PT goals: Progressing toward goals    Frequency    7X/week      PT Plan Current plan remains appropriate    Co-evaluation              AM-PAC PT "6 Clicks" Daily Activity  Outcome Measure  Difficulty turning over in bed (including adjusting bedclothes, sheets and blankets)?: A Little Difficulty moving from  lying on back to sitting on the side of the bed? : A Little Difficulty sitting down on and standing up from a chair with arms (e.g., wheelchair, bedside commode, etc,.)?: A Little Help needed moving to and from a bed to chair (including a wheelchair)?: A Little Help needed walking in hospital room?: A Little Help needed climbing 3-5 steps with a railing? : A Little 6 Click Score: 18    End of Session Equipment Utilized During Treatment: Gait belt Activity Tolerance: Patient tolerated treatment well Patient left: in chair;with call bell/phone within reach   PT Visit Diagnosis: Muscle weakness  (generalized) (M62.81);Difficulty in walking, not elsewhere classified (R26.2);Pain Pain - Right/Left: Right Pain - part of body: Hip     Time: 1130-1156 PT Time Calculation (min) (ACUTE ONLY): 26 min  Charges:  $Gait Training: 8-22 mins $Therapeutic Exercise: 8-22 mins                    G Codes:          Weston Anna, MPT Pager: [phone]

## 2017-07-05 ENCOUNTER — Other Ambulatory Visit: Payer: Self-pay

## 2017-07-05 NOTE — Patient Outreach (Signed)
[facility]) Care Management  07/05/2017  Sara Figueroa 06/02/1960 [id number]   Telephone call for transition of care call.  Member was hospitalized for Rt total hip arthoplasty on 07/02/17 and was discharged on 07/04/17.  Subjective:  Member states that she is doing very well after her surgery.  States she is staying with family for a few days before she goes back to her home on [address].  States her husband is with her and he is her transportation.  States her pain is being controlled with her pain medication and she is taking about every 5-6 hours.  States she is to go back to see Dr.Olin on 07/18/17.  States she has been doing the exercises that they taught her and she is using her walker.  Objective: S/P rt total hip arthoplasty  Assessment:  Transition of care call completed in transition of care template. Medications reviewed and member has picked up her medications from the pharmacy at [facility] Reviewed benefits and member has completed Family Medical Leave forms and has filed for short term disability   Reviewed discharge instruction and s/s to call MD Reinforced importance of keeping follow up appt with Dr.Olin on 07/18/17  Plan: Plan to send successful out reach letter. Member assessed with no further outreach needed.  Peter Garter RN, [facility] Care Management Coordinator [facility] Care Management [phone]

## 2017-07-09 NOTE — Discharge Summary (Signed)
Physician Discharge Summary  Patient ID: Sara Figueroa MRN: [id number] DOB/AGE: [birth year] 57 y.o.  Admit date: 07/03/2017 Discharge date: 07/04/2017   Procedures:  Procedure(s) (LRB): RIGHT TOTAL HIP ARTHROPLASTY ANTERIOR APPROACH (Right)  Attending Physician:  Dr. Paralee Cancel   Admission Diagnoses:   Right hip primary OA / pain  Discharge Diagnoses:  Principal Problem:   S/P right THA, AA Active Problems:   Overweight (BMI 25.0-29.9)  Past Medical History:  Diagnosis Date  . Allergy    seasonal  . Arthritis   . Complication of anesthesia    Anesthesia please look at MRI  Lumbar spine-results from 08/05/2013  . Heart murmur of newborn     HPI:    Sara Figueroa, 56 y.o. female, has a history of pain and functional disability in the right hip(s) due to arthritis and patient has failed non-surgical conservative treatments for greater than 12 weeks to include NSAID's and/or analgesics and activity modification.  Onset of symptoms was gradual starting ~5 years ago with gradually worsening course since that time.The patient noted no past surgery on the right hip(s).  Patient currently rates pain in the right hip at 8 out of 10 with activity. Patient has worsening of pain with activity and weight bearing, trendelenberg gait, pain that interfers with activities of daily living and pain with passive range of motion. Patient has evidence of periarticular osteophytes and joint space narrowing by imaging studies. This condition presents safety issues increasing the risk of falls.  There is no current active infection.   Risks, benefits and expectations were discussed with the patient.  Risks including but not limited to the risk of anesthesia, blood clots, nerve damage, blood vessel damage, failure of the prosthesis, infection and up to and including death.  Patient understand the risks, benefits and expectations and wishes to proceed with surgery.   PCP: Mar Daring, PA-C    Discharged Condition: good  Hospital Course:  Patient underwent the above stated procedure on 07/03/2017. Patient tolerated the procedure well and brought to the recovery room in good condition and subsequently to the floor.  POD #1 BP: 133/74 ; Pulse: 76 ; Temp: 99 F (37.2 C) ; Resp: 16 Patient reports pain as mild, pain controlled. No events throughout the night.  Discussed her activities levels over the next 6 weeks.  Transitioning from a walker to cane to nothing. Ready to be discharged home.  Dorsiflexion/plantar flexion intact, incision: dressing C/D/I, no cellulitis present and compartment soft.   LABS  Basename    HGB     10.2  HCT     31.5    Discharge Exam: General appearance: alert, cooperative and no distress Extremities: Homans sign is negative, no sign of DVT, no edema, redness or tenderness in the calves or thighs and no ulcers, gangrene or trophic changes  Disposition:  Home with follow up in 2 weeks   Follow-up Information    Paralee Cancel, MD. Schedule an appointment as soon as possible for a visit in 2 weeks.   Specialty:  Orthopedic Surgery Contact information: [address] [place] [address] [phone]           Discharge Instructions    Call MD / Call 911   Complete by:  As directed    If you experience chest pain or shortness of breath, CALL 911 and be transported to the hospital emergency room.  If you develope a fever above 101 F, pus (white drainage) or increased  drainage or redness at the wound, or calf pain, call your surgeon's office.   Change dressing   Complete by:  As directed    Maintain surgical dressing until follow up in the clinic. If the edges start to pull up, may reinforce with tape. If the dressing is no longer working, may remove and cover with gauze and tape, but must keep the area dry and clean.  Call with any questions or concerns.   Constipation Prevention   Complete by:  As directed    Drink plenty of  fluids.  Prune juice may be helpful.  You may use a stool softener, such as Colace (over the counter) 100 mg twice a day.  Use MiraLax (over the counter) for constipation as needed.   Diet - low sodium heart healthy   Complete by:  As directed    Discharge instructions   Complete by:  As directed    Maintain surgical dressing until follow up in the clinic. If the edges start to pull up, may reinforce with tape. If the dressing is no longer working, may remove and cover with gauze and tape, but must keep the area dry and clean.  Follow up in 2 weeks at [facility]. Call with any questions or concerns.   Increase activity slowly as tolerated   Complete by:  As directed    Weight bearing as tolerated with assist device (walker, cane, etc) as directed, use it as long as suggested by your surgeon or therapist, typically at least 4-6 weeks.   TED hose   Complete by:  As directed    Use stockings (TED hose) for 2 weeks on both leg(s).  You may remove them at night for sleeping.      Allergies as of 07/04/2017   No Known Allergies     Medication List    STOP taking these medications   ibuprofen 200 MG tablet Commonly known as:  ADVIL,MOTRIN     TAKE these medications   aspirin 81 MG chewable tablet Commonly known as:  ASPIRIN CHILDRENS Chew 1 tablet (81 mg total) by mouth 2 (two) times daily. Take for 4 weeks, then resume regular dose.   CALCIUM PLUS VITAMIN D3 600-800 MG-UNIT Tabs Generic drug:  Calcium Carb-Cholecalciferol Take 1 tablet by mouth daily.   docusate sodium 100 MG capsule Commonly known as:  COLACE Take 1 capsule (100 mg total) by mouth 2 (two) times daily.   Doxepin HCl 3 MG Tabs Take 1 tablet (3 mg total) by mouth at bedtime as needed.   ferrous sulfate 325 (65 FE) MG tablet Commonly known as:  FERROUSUL Take 1 tablet (325 mg total) by mouth 3 (three) times daily with meals. What changed:  when to take this   HYDROcodone-acetaminophen 7.5-325 MG  tablet Commonly known as:  NORCO Take 1-2 tablets by mouth every 4 (four) hours as needed for moderate pain.   loratadine 10 MG tablet Commonly known as:  CLARITIN Take 10 mg by mouth daily as needed.   methocarbamol 500 MG tablet Commonly known as:  ROBAXIN Take 1 tablet (500 mg total) by mouth every 6 (six) hours as needed for muscle spasms.   multivitamin tablet Take 1 tablet by mouth daily.   omeprazole 20 MG capsule Commonly known as:  PRILOSEC Take 20 mg by mouth daily as needed.   polyethylene glycol packet Commonly known as:  MIRALAX / GLYCOLAX Take 17 g by mouth 2 (two) times daily.   valACYclovir 1000 MG  tablet Commonly known as:  VALTREX Take 1 tablet (1,000 mg total) by mouth 2 (two) times daily. What changed:  when to take this            Discharge Care Instructions  (From admission, onward)        Start     Ordered   07/04/17 0000  Change dressing    Comments:  Maintain surgical dressing until follow up in the clinic. If the edges start to pull up, may reinforce with tape. If the dressing is no longer working, may remove and cover with gauze and tape, but must keep the area dry and clean.  Call with any questions or concerns.   07/04/17 1610       Signed: West Pugh. Rochelle Larue   PA-C  07/09/2017, 12:44 PM

## 2017-08-22 DIAGNOSIS — Z471 Aftercare following joint replacement surgery: Secondary | ICD-10-CM | POA: Diagnosis not present

## 2017-08-22 DIAGNOSIS — Z96641 Presence of right artificial hip joint: Secondary | ICD-10-CM | POA: Diagnosis not present

## 2017-10-03 ENCOUNTER — Other Ambulatory Visit: Payer: Self-pay

## 2017-10-03 DIAGNOSIS — I83893 Varicose veins of bilateral lower extremities with other complications: Secondary | ICD-10-CM

## 2017-10-03 DIAGNOSIS — M7989 Other specified soft tissue disorders: Secondary | ICD-10-CM

## 2017-10-23 DIAGNOSIS — M19072 Primary osteoarthritis, left ankle and foot: Secondary | ICD-10-CM | POA: Diagnosis not present

## 2017-10-23 DIAGNOSIS — M2042 Other hammer toe(s) (acquired), left foot: Secondary | ICD-10-CM | POA: Diagnosis not present

## 2017-10-23 DIAGNOSIS — Q6622 Congenital metatarsus adductus: Secondary | ICD-10-CM | POA: Diagnosis not present

## 2017-11-28 ENCOUNTER — Ambulatory Visit (HOSPITAL_COMMUNITY)
Admission: RE | Admit: 2017-11-28 | Discharge: 2017-11-28 | Disposition: A | Discharge status: Home / Self Care | Payer: 59 | Source: Ambulatory Visit | Attending: Vascular Surgery | Admitting: Vascular Surgery

## 2017-11-28 ENCOUNTER — Ambulatory Visit (INDEPENDENT_AMBULATORY_CARE_PROVIDER_SITE_OTHER): Payer: 59 | Admitting: Vascular Surgery

## 2017-11-28 ENCOUNTER — Encounter: Payer: Self-pay | Admitting: Vascular Surgery

## 2017-11-28 VITALS — BP 152/86 | HR 87 | Temp 97.7°F | Resp 16 | Ht 64.5 in | Wt 168.0 lb

## 2017-11-28 DIAGNOSIS — I83813 Varicose veins of bilateral lower extremities with pain: Secondary | ICD-10-CM

## 2017-11-28 DIAGNOSIS — M7989 Other specified soft tissue disorders: Secondary | ICD-10-CM | POA: Insufficient documentation

## 2017-11-28 DIAGNOSIS — I83893 Varicose veins of bilateral lower extremities with other complications: Secondary | ICD-10-CM | POA: Insufficient documentation

## 2017-11-28 DIAGNOSIS — R609 Edema, unspecified: Secondary | ICD-10-CM | POA: Diagnosis present

## 2017-11-28 NOTE — Progress Notes (Signed)
REASON FOR CONSULT:    Varicose veins right lower extremity and left lower extremity.  The consult is requested by Rexford Maus  HPI:   Sara Figueroa is a pleasant 57 y.o. female, who presents for painful varicose veins mostly of the right lower extremity.  She noted the gradual onset of these varicosities over the last year and the right medial calf varicosities have become more prominent.  She does describe some aching pain in her legs associated with standing and sitting and relieved with elevation however the symptoms are quite tolerable.  She also notes some mild swelling in both lower extremities slightly more significant on the left side.  She has no previous history of DVT or phlebitis.  She is had previous sclerotherapy in both legs.  She denies any history of claudication, rest pain, or nonhealing ulcers.  Past Medical History:  Diagnosis Date  . Allergy    seasonal  . Arthritis   . Complication of anesthesia    Anesthesia please look at MRI  Lumbar spine-results from 08/05/2013  . Heart murmur of newborn     Family History  Problem Relation Age of Onset  . Heart disease Mother   . Hypertension Mother   . Ulcerative colitis Mother   . Atrial fibrillation Mother   . Emphysema Father   . Parkinson's disease Father   . Skin cancer Father   . Stroke Father   . Fibroids Sister   . Hypertension Brother   . Cancer Maternal Grandmother   . Cancer Maternal Grandfather   . Congestive Heart Failure Paternal Grandmother   . Heart failure Paternal Grandmother   . Breast cancer Neg Hx     SOCIAL HISTORY: Social History   Socioeconomic History  . Marital status: Married    Spouse name: Not on file  . Number of children: Not on file  . Years of education: Not on file  . Highest education level: Not on file  Occupational History  . Not on file  Social Needs  . Financial resource strain: Not on file  . Food insecurity:    Worry: Not on file    Inability: Not on  file  . Transportation needs:    Medical: Not on file    Non-medical: Not on file  Tobacco Use  . Smoking status: Former Smoker    Packs/day: 0.50    Years: 20.00    Pack years: 10.00    Types: Cigarettes  . Smokeless tobacco: Never Used  . Tobacco comment: quit in 2006  Substance and Sexual Activity  . Alcohol use: Yes    Comment: DRINKS 1 GLASS OF WINE EVERY OTHER NIGHT  . Drug use: No  . Sexual activity: Not on file  Lifestyle  . Physical activity:    Days per week: Not on file    Minutes per session: Not on file  . Stress: Not on file  Relationships  . Social connections:    Talks on phone: Not on file    Gets together: Not on file    Attends religious service: Not on file    Active member of club or organization: Not on file    Attends meetings of clubs or organizations: Not on file    Relationship status: Not on file  . Intimate partner violence:    Fear of current or ex partner: Not on file    Emotionally abused: Not on file    Physically abused: Not on file  Forced sexual activity: Not on file  Other Topics Concern  . Not on file  Social History Narrative  . Not on file    No Known Allergies  Current Outpatient Medications  Medication Sig Dispense Refill  . Doxepin HCl 3 MG TABS Take 1 tablet (3 mg total) by mouth at bedtime as needed. 90 tablet 1  . loratadine (CLARITIN) 10 MG tablet Take 10 mg by mouth daily as needed.     . Multiple Vitamin (MULTIVITAMIN) tablet Take 1 tablet by mouth daily.    Marland Kitchen omeprazole (PRILOSEC) 20 MG capsule Take 20 mg by mouth daily as needed.     . valACYclovir (VALTREX) 1000 MG tablet Take 1 tablet (1,000 mg total) by mouth 2 (two) times daily. (Patient taking differently: Take 1,000 mg by mouth daily. ) 60 tablet 4  . Calcium Carb-Cholecalciferol (CALCIUM PLUS VITAMIN D3) 600-800 MG-UNIT TABS Take 1 tablet by mouth daily.    Marland Kitchen docusate sodium (COLACE) 100 MG capsule Take 1 capsule (100 mg total) by mouth 2 (two) times daily.  (Patient not taking: Reported on 11/28/2017) 10 capsule 0  . ferrous sulfate (FERROUSUL) 325 (65 FE) MG tablet Take 1 tablet (325 mg total) by mouth 3 (three) times daily with meals. (Patient not taking: Reported on 11/28/2017)  3  . HYDROcodone-acetaminophen (NORCO) 7.5-325 MG tablet Take 1-2 tablets by mouth every 4 (four) hours as needed for moderate pain. (Patient not taking: Reported on 11/28/2017) 60 tablet 0  . methocarbamol (ROBAXIN) 500 MG tablet Take 1 tablet (500 mg total) by mouth every 6 (six) hours as needed for muscle spasms. (Patient not taking: Reported on 11/28/2017) 40 tablet 0  . polyethylene glycol (MIRALAX / GLYCOLAX) packet Take 17 g by mouth 2 (two) times daily. (Patient not taking: Reported on 11/28/2017) 14 each 0   No current facility-administered medications for this visit.     REVIEW OF SYSTEMS:  [X]  denotes positive finding, [ ]  denotes negative finding Cardiac  Comments:  Chest pain or chest pressure:    Shortness of breath upon exertion:    Short of breath when lying flat:    Irregular heart rhythm:        Vascular    Pain in calf, thigh, or hip brought on by ambulation:    Pain in feet at night that wakes you up from your sleep:     Blood clot in your veins:    Leg swelling:  x       Pulmonary    Oxygen at home:    Productive cough:     Wheezing:         Neurologic    Sudden weakness in arms or legs:     Sudden numbness in arms or legs:     Sudden onset of difficulty speaking or slurred speech:    Temporary loss of vision in one eye:     Problems with dizziness:         Gastrointestinal    Blood in stool:     Vomited blood:         Genitourinary    Burning when urinating:     Blood in urine:        Psychiatric    Major depression:         Hematologic    Bleeding problems:    Problems with blood clotting too easily:        Skin    Rashes or ulcers:  Constitutional    Fever or chills:     PHYSICAL EXAM:   Vitals:   11/28/17  1243 11/28/17 1245  BP: (!) 155/86 (!) 152/86  Pulse: 87   Resp: 16   Temp: 97.7 F (36.5 C)   SpO2: 98%   Weight: 168 lb (76.2 kg)   Height: 5' 4.5" (1.638 m)     GENERAL: The patient is a well-nourished female, in no acute distress. The vital signs are documented above. CARDIAC: There is a regular rate and rhythm.  VASCULAR: I do not detect carotid bruits. She has palpable dorsalis pedis and posterior tibial pulses bilaterally. She has some varicose veins in her medial right calf and some telangiectasias in both thighs calves and popliteal fossa bilaterally.  The spider veins are more significant on the anterior lateral posterior aspect of her right thigh. PULMONARY: There is good air exchange bilaterally without wheezing or rales. ABDOMEN: Soft and non-tender with normal pitched bowel sounds.  MUSCULOSKELETAL: There are no major deformities or cyanosis. NEUROLOGIC: No focal weakness or paresthesias are detected. SKIN: There are no ulcers or rashes noted. PSYCHIATRIC: The patient has a normal affect.  DATA:    VENOUS DUPLEX: I have independently interpreted her venous duplex scan today.  On the right side there is no evidence of DVT or superficial thrombophlebitis.  There is no significant deep venous reflux.  There is some reflux in the great saphenous vein from the proximal calf to the distal thigh.  However the vein is not especially enlarged.  On the left side there is no evidence of DVT or superficial thrombophlebitis.  There is no significant deep venous reflux.  There is some reflux in this great saphenous vein in the proximal calf and at the knee.  This vein likewise is not significantly dilated.   ASSESSMENT & PLAN:   CHRONIC VENOUS INSUFFICIENCY: Patient does have CEAP clinical class III venous disease.  Currently I do not think she is a good candidate for endovenous laser ablation of the great saphenous veins although she does have some reflux.  We have discussed the  importance of intermittent leg elevation the proper positioning for this.  We also discussed the importance of wearing compression stockings especially when she sitting and standing for prolonged periods of time.  I encouraged her to avoid prolonged sitting and standing.  In addition we discussed the importance of exercise specifically walking and water aerobics.  In addition we discussed the importance of proper weight management.  If her symptoms progress in the future then certainly we could repeat her duplex to see if her disease has progressed.  I will be happy to see her back at any time.  Deitra Mayo [facility] [phone]

## 2017-12-04 ENCOUNTER — Encounter: Payer: Self-pay | Admitting: Physician Assistant

## 2017-12-11 DIAGNOSIS — D2262 Melanocytic nevi of left upper limb, including shoulder: Secondary | ICD-10-CM | POA: Diagnosis not present

## 2017-12-11 DIAGNOSIS — D2272 Melanocytic nevi of left lower limb, including hip: Secondary | ICD-10-CM | POA: Diagnosis not present

## 2017-12-11 DIAGNOSIS — D225 Melanocytic nevi of trunk: Secondary | ICD-10-CM | POA: Diagnosis not present

## 2017-12-11 DIAGNOSIS — D2261 Melanocytic nevi of right upper limb, including shoulder: Secondary | ICD-10-CM | POA: Diagnosis not present

## 2017-12-11 DIAGNOSIS — D2271 Melanocytic nevi of right lower limb, including hip: Secondary | ICD-10-CM | POA: Diagnosis not present

## 2018-05-03 DIAGNOSIS — B0059 Other herpesviral disease of eye: Secondary | ICD-10-CM | POA: Diagnosis not present

## 2018-05-26 ENCOUNTER — Encounter: Payer: Self-pay | Admitting: Internal Medicine

## 2018-08-07 DIAGNOSIS — Z471 Aftercare following joint replacement surgery: Secondary | ICD-10-CM | POA: Diagnosis not present

## 2018-08-07 DIAGNOSIS — Z96641 Presence of right artificial hip joint: Secondary | ICD-10-CM | POA: Diagnosis not present

## 2018-08-09 ENCOUNTER — Ambulatory Visit: Payer: 59

## 2018-08-09 ENCOUNTER — Encounter: Payer: Self-pay | Admitting: Internal Medicine

## 2018-08-09 ENCOUNTER — Other Ambulatory Visit: Payer: Self-pay

## 2018-08-09 VITALS — Ht 64.5 in | Wt 170.0 lb

## 2018-08-09 MED ORDER — NA SULFATE-K SULFATE-MG SULF 17.5-3.13-1.6 GM/177ML PO SOLN: 1.0000 | Freq: Once | ORAL | 0 refills | Status: AC

## 2018-08-09 NOTE — Progress Notes (Signed)
Per pt, no allergies to soy or egg products.Pt not taking any weight loss meds or using  O2 at home. Pt states she does not have any sedation problems with IV sedation, only if she has an epidural! Pt refused emmi video.  The PV was done over the phone due to COVID-19. I verified the pt's address and insurance with her. I reviewed her medical history and prep instructions with the pt and will mail the paperwork to her today. The pt was informed to call if she has any questions or changes prior to her procedure. She understood.

## 2018-08-22 ENCOUNTER — Telehealth: Payer: Self-pay | Admitting: *Deleted

## 2018-08-22 NOTE — Telephone Encounter (Signed)
LM on VM for pt to call back for screening questions.

## 2018-08-22 NOTE — Telephone Encounter (Signed)
Covid-19 screening questions  Have you traveled in the last 14 days? no If yes where?  Do you now or have you had a fever in the last 14 days? no  Do you have any respiratory symptoms of shortness of breath or cough now or in the last 14 days? no  Do you have any family members or close contacts with diagnosed or suspected Covid-19 in the past 14 days? no  Have you been tested for Covid-19 and found to be positive? no       

## 2018-08-23 ENCOUNTER — Other Ambulatory Visit: Payer: Self-pay

## 2018-08-23 ENCOUNTER — Encounter: Payer: Self-pay | Admitting: Internal Medicine

## 2018-08-23 ENCOUNTER — Ambulatory Visit (AMBULATORY_SURGERY_CENTER): Payer: 59 | Admitting: Internal Medicine

## 2018-08-23 VITALS — BP 183/91 | HR 68 | Temp 98.7°F | Resp 17 | Ht 64.5 in | Wt 170.0 lb

## 2018-08-23 DIAGNOSIS — Z86010 Personal history of colonic polyps: Secondary | ICD-10-CM

## 2018-08-23 DIAGNOSIS — D122 Benign neoplasm of ascending colon: Secondary | ICD-10-CM

## 2018-08-23 DIAGNOSIS — Z1211 Encounter for screening for malignant neoplasm of colon: Secondary | ICD-10-CM | POA: Diagnosis not present

## 2018-08-23 MED ORDER — SODIUM CHLORIDE 0.9 % IV SOLN: 500.0000 mL | Freq: Once | INTRAVENOUS | Status: DC

## 2018-08-23 NOTE — Progress Notes (Signed)
Called to room to assist during endoscopic procedure.  Patient ID and intended procedure confirmed with present staff. Received instructions for my participation in the procedure from the performing physician.  

## 2018-08-23 NOTE — Patient Instructions (Signed)
2 polyps removed today. Some diverticulosis noted.    YOU HAD AN ENDOSCOPIC PROCEDURE TODAY AT [facility]:   Refer to the procedure report that was given to you for any specific questions about what was found during the examination.  If the procedure report does not answer your questions, please call your gastroenterologist to clarify.  If you requested that your care partner not be given the details of your procedure findings, then the procedure report has been included in a sealed envelope for you to review at your convenience later.  YOU SHOULD EXPECT: Some feelings of bloating in the abdomen. Passage of more gas than usual.  Walking can help get rid of the air that was put into your GI tract during the procedure and reduce the bloating. If you had a lower endoscopy (such as a colonoscopy or flexible sigmoidoscopy) you may notice spotting of blood in your stool or on the toilet paper. If you underwent a bowel prep for your procedure, you may not have a normal bowel movement for a few days.  Please Note:  You might notice some irritation and congestion in your nose or some drainage.  This is from the oxygen used during your procedure.  There is no need for concern and it should clear up in a day or so.  SYMPTOMS TO REPORT IMMEDIATELY:   Following lower endoscopy (colonoscopy or flexible sigmoidoscopy):  Excessive amounts of blood in the stool  Significant tenderness or worsening of abdominal pains  Swelling of the abdomen that is new, acute  Fever of 100F or higher   For urgent or emergent issues, a gastroenterologist can be reached at any hour by calling [phone].   DIET:  We do recommend a small meal at first, but then you may proceed to your regular diet.  Drink plenty of fluids but you should avoid alcoholic beverages for 24 hours.  ACTIVITY:  You should plan to take it easy for the rest of today and you should NOT DRIVE or use heavy machinery until tomorrow  (because of the sedation medicines used during the test).    FOLLOW UP: Our staff will call the number listed on your records 48-72 hours following your procedure to check on you and address any questions or concerns that you may have regarding the information given to you following your procedure. If we do not reach you, we will leave a message.  We will attempt to reach you two times.  During this call, we will ask if you have developed any symptoms of COVID 19. If you develop any symptoms (ie: fever, flu-like symptoms, shortness of breath, cough etc.) before then, please call [phone].  If you test positive for Covid 19 in the 2 weeks post procedure, please call and report this information to [place].    If any biopsies were taken you will be contacted by phone or by letter within the next 1-3 weeks.  Please call us at [phone] if you have not heard about the biopsies in 3 weeks.    SIGNATURES/CONFIDENTIALITY: You and/or your care partner have signed paperwork which will be entered into your electronic medical record.  These signatures attest to the fact that that the information above on your After Visit Summary has been reviewed and is understood.  Full responsibility of the confidentiality of this discharge information lies with you and/or your care-partner.

## 2018-08-23 NOTE — Progress Notes (Signed)
To PACU, VSS. Report to [email or web address] 

## 2018-08-23 NOTE — Progress Notes (Signed)
Vital signs JB Temp JB/Wheeler

## 2018-08-23 NOTE — Op Note (Signed)
[place] Patient Name: Sara Figueroa Procedure Date: 08/23/2018 2:36 PM MRN: [id number] Endoscopist: Jerene Bears , MD Age: 58 Referring MD:  Date of Birth: Dec 15, 1960 Gender: Female Account #: [id number] Procedure:                Colonoscopy Indications:              High risk colon cancer surveillance: Personal                            history of non-advanced adenoma, Last colonoscopy 5                            years ago Medicines:                Monitored Anesthesia Care Procedure:                Pre-Anesthesia Assessment:                           - Prior to the procedure, a History and Physical                            was performed, and patient medications and                            allergies were reviewed. The patient's tolerance of                            previous anesthesia was also reviewed. The risks                            and benefits of the procedure and the sedation                            options and risks were discussed with the patient.                            All questions were answered, and informed consent                            was obtained. Prior Anticoagulants: The patient has                            taken no previous anticoagulant or antiplatelet                            agents. ASA Grade Assessment: II - A patient with                            mild systemic disease. After reviewing the risks                            and benefits, the patient was deemed in  satisfactory condition to undergo the procedure.                           After obtaining informed consent, the colonoscope                            was passed under direct vision. Throughout the                            procedure, the patient's blood pressure, pulse, and                            oxygen saturations were monitored continuously. The                            Model PCF-[id number]) scope was introduced                           through the anus and advanced to the cecum,                            identified by appendiceal orifice and ileocecal                            valve. The colonoscopy was performed without                            difficulty. The patient tolerated the procedure                            well. The quality of the bowel preparation was                            good. The ileocecal valve, appendiceal orifice, and                            rectum were photographed. Scope In: 2:42:52 PM Scope Out: 2:55:56 PM Scope Withdrawal Time: 0 hours 10 minutes 41 seconds  Total Procedure Duration: 0 hours 13 minutes 4 seconds  Findings:                 The digital rectal exam was normal.                           Two sessile polyps were found in the ascending                            colon. The polyps were 3 to 4 mm in size. These                            polyps were removed with a cold snare. Resection                            and retrieval were complete.  A few small-mouthed diverticula were found in the                            sigmoid colon and ascending colon.                           The retroflexed view of the distal rectum and anal                            verge was normal and showed no anal or rectal                            abnormalities. Complications:            No immediate complications. Estimated Blood Loss:     Estimated blood loss was minimal. Impression:               - Two 3 to 4 mm polyps in the ascending colon,                            removed with a cold snare. Resected and retrieved.                           - Diverticulosis in the sigmoid colon and in the                            ascending colon.                           - The distal rectum and anal verge are normal on                            retroflexion view. Recommendation:           - Patient has a contact number available for                             emergencies. The signs and symptoms of potential                            delayed complications were discussed with the                            patient. Return to normal activities tomorrow.                            Written discharge instructions were provided to the                            patient.                           - Resume previous diet.                           - Continue present medications.                           -  Await pathology results.                           - Repeat colonoscopy is recommended for                            surveillance. The colonoscopy date will be                            determined after pathology results from today's                            exam become available for review. Jerene Bears, MD 08/23/2018 3:01:09 PM This report has been signed electronically.

## 2018-08-27 ENCOUNTER — Telehealth: Payer: Self-pay | Admitting: *Deleted

## 2018-08-27 ENCOUNTER — Telehealth: Payer: Self-pay

## 2018-08-27 ENCOUNTER — Encounter: Payer: Self-pay | Admitting: Physician Assistant

## 2018-08-27 DIAGNOSIS — R7989 Other specified abnormal findings of blood chemistry: Secondary | ICD-10-CM

## 2018-08-27 DIAGNOSIS — Z13220 Encounter for screening for lipoid disorders: Secondary | ICD-10-CM

## 2018-08-27 DIAGNOSIS — D508 Other iron deficiency anemias: Secondary | ICD-10-CM

## 2018-08-27 DIAGNOSIS — R7309 Other abnormal glucose: Secondary | ICD-10-CM

## 2018-08-27 NOTE — Telephone Encounter (Signed)
No answer, left message to call back later today, B.Tevis Dunavan RN. 

## 2018-08-27 NOTE — Telephone Encounter (Signed)
  Follow up Call-  Call back number 08/23/2018  Post procedure Call Back phone  # [phone]  Permission to leave phone message No  Some recent data might be hidden     Patient questions:  Do you have a fever, pain , or abdominal swelling? No. Pain Score  0 *  Have you tolerated food without any problems? Yes.    Have you been able to return to your normal activities? Yes.    Do you have any questions about your discharge instructions: Diet   NO. Medications  No. Follow up visit  No.  Do you have questions or concerns about your Care? No.  Actions: * If pain score is 4 or above: No action needed, pain <4.   1. Have you developed a fever since your procedure? NO  2.   Have you had an respiratory symptoms (SOB or cough) since your procedure? NO  3.   Have you tested positive for COVID 19 since your procedure NO  4.   Have you had any family members/close contacts diagnosed with the COVID 19 since your procedure?  NO  If yes to any of these questions please route to Joylene John, RN and Alphonsa Gin, RN.

## 2018-08-28 ENCOUNTER — Other Ambulatory Visit
Admission: RE | Admit: 2018-08-28 | Discharge: 2018-08-28 | Disposition: A | Discharge status: Home / Self Care | Payer: 59 | Attending: Physician Assistant | Admitting: Physician Assistant

## 2018-08-28 ENCOUNTER — Other Ambulatory Visit: Payer: Self-pay

## 2018-08-28 DIAGNOSIS — R7309 Other abnormal glucose: Secondary | ICD-10-CM | POA: Insufficient documentation

## 2018-08-28 DIAGNOSIS — Z13220 Encounter for screening for lipoid disorders: Secondary | ICD-10-CM | POA: Diagnosis not present

## 2018-08-28 DIAGNOSIS — D508 Other iron deficiency anemias: Secondary | ICD-10-CM | POA: Diagnosis not present

## 2018-08-28 DIAGNOSIS — R7989 Other specified abnormal findings of blood chemistry: Secondary | ICD-10-CM | POA: Diagnosis not present

## 2018-08-28 DIAGNOSIS — Z136 Encounter for screening for cardiovascular disorders: Secondary | ICD-10-CM | POA: Diagnosis not present

## 2018-08-28 LAB — CBC WITH DIFFERENTIAL/PLATELET
Abs Immature Granulocytes: 0.02 10*3/uL (ref 0.00–0.07)
Basophils Absolute: 0 10*3/uL (ref 0.0–0.1)
Basophils Relative: 1 %
Eosinophils Absolute: 0.2 10*3/uL (ref 0.0–0.5)
Eosinophils Relative: 5 %
HCT: 41.3 % (ref 36.0–46.0)
Hemoglobin: 13.7 g/dL (ref 12.0–15.0)
Immature Granulocytes: 0 %
Lymphocytes Relative: 28 %
Lymphs Abs: 1.5 10*3/uL (ref 0.7–4.0)
MCH: 31.3 pg (ref 26.0–34.0)
MCHC: 33.2 g/dL (ref 30.0–36.0)
MCV: 94.3 fL (ref 80.0–100.0)
Monocytes Absolute: 0.5 10*3/uL (ref 0.1–1.0)
Monocytes Relative: 9 %
Neutro Abs: 3.1 10*3/uL (ref 1.7–7.7)
Neutrophils Relative %: 57 %
Platelets: 268 10*3/uL (ref 150–400)
RBC: 4.38 MIL/uL (ref 3.87–5.11)
RDW: 12.4 % (ref 11.5–15.5)
WBC: 5.4 10*3/uL (ref 4.0–10.5)
nRBC: 0 % (ref 0.0–0.2)

## 2018-08-28 LAB — COMPREHENSIVE METABOLIC PANEL
ALT: 12 U/L (ref 0–44)
AST: 13 U/L — ABNORMAL LOW (ref 15–41)
Albumin: 3.9 g/dL (ref 3.5–5.0)
Alkaline Phosphatase: 62 U/L (ref 38–126)
Anion gap: 7 (ref 5–15)
BUN: 17 mg/dL (ref 6–20)
CO2: 27 mmol/L (ref 22–32)
Calcium: 9.5 mg/dL (ref 8.9–10.3)
Chloride: 105 mmol/L (ref 98–111)
Creatinine, Ser: 0.83 mg/dL (ref 0.44–1.00)
GFR calc Af Amer: >60 mL/min (ref >60)
GFR calc non Af Amer: >60 mL/min (ref >60)
Glucose, Bld: 96 mg/dL (ref 70–99)
Potassium: 4.7 mmol/L (ref 3.5–5.1)
Sodium: 139 mmol/L (ref 135–145)
Total Bilirubin: 0.9 mg/dL (ref 0.3–1.2)
Total Protein: 7.1 g/dL (ref 6.5–8.1)

## 2018-08-28 LAB — HEMOGLOBIN A1C
Hgb A1c MFr Bld: 5.6 % (ref 4.8–5.6)
Mean Plasma Glucose: 114.02 mg/dL

## 2018-08-28 LAB — LIPID PANEL
Cholesterol: 184 mg/dL (ref 0–200)
HDL: 82 mg/dL (ref >40)
LDL Cholesterol: 83 mg/dL (ref 0–99)
Total CHOL/HDL Ratio: 2.2 RATIO
Triglycerides: 94 mg/dL (ref <150)
VLDL: 19 mg/dL (ref 0–40)

## 2018-08-28 LAB — TSH: TSH: 0.661 u[IU]/mL (ref 0.350–4.500)

## 2018-08-28 NOTE — Progress Notes (Signed)
     Patient: Sara Figueroa, Female    DOB: 08/30/1960, 58 y.o.   MRN: [id number] Visit Date: 08/29/2018  Today's Provider: Jennifer M Burnette, PA-C   Chief Complaint  Patient presents with  . Annual Exam   Subjective:    I,Sara Figueroa,RMA am acting as a scribe for Jennifer M. Burnette, PA-C.;   Annual physical exam Sara Figueroa is a 58 y.o. female who presents today for health maintenance and complete physical. She feels well. She reports exercising. She reports she is sleeping fairly well, staying in three different places currently. ----------------------------------------------------------------- Last pap:10/01/2014 Normal Mammogram:12/08/2015 Normal  Review of Systems  Constitutional: Positive for fatigue.  HENT: Negative.   Eyes: Negative.   Respiratory: Negative.   Cardiovascular: Negative.   Gastrointestinal: Negative.   Endocrine: Negative.   Genitourinary: Negative.   Musculoskeletal: Negative.   Skin: Negative.   Allergic/Immunologic: Negative.   Neurological: Negative.   Hematological: Negative.   Psychiatric/Behavioral: Positive for sleep disturbance.    Social History      She  reports that she has quit smoking. Her smoking use included cigarettes. She has a 10.00 pack-year smoking history. She has never used smokeless tobacco. She reports current alcohol use of about 3.0 - 4.0 standard drinks of alcohol per week. She reports that she does not use drugs.       Social History   Socioeconomic History  . Marital status: Married    Spouse name: Not on file  . Number of children: Not on file  . Years of education: Not on file  . Highest education level: Not on file  Occupational History  . Not on file  Social Needs  . Financial resource strain: Not on file  . Food insecurity    Worry: Not on file    Inability: Not on file  . Transportation needs    Medical: Not on file    Non-medical: Not on file  Tobacco Use  . Smoking status:  Former Smoker    Packs/day: 0.50    Years: 20.00    Pack years: 10.00    Types: Cigarettes  . Smokeless tobacco: Never Used  . Tobacco comment: quit in 2006  Substance and Sexual Activity  . Alcohol use: Yes    Alcohol/week: 3.0 - 4.0 standard drinks    Types: 3 - 4 Glasses of wine per week    Comment: DRINKS 1 GLASS OF WINE EVERY OTHER NIGHT  . Drug use: No  . Sexual activity: Not on file  Lifestyle  . Physical activity    Days per week: Not on file    Minutes per session: Not on file  . Stress: Not on file  Relationships  . Social connections    Talks on phone: Not on file    Gets together: Not on file    Attends religious service: Not on file    Active member of club or organization: Not on file    Attends meetings of clubs or organizations: Not on file    Relationship status: Not on file  Other Topics Concern  . Not on file  Social History Narrative  . Not on file    Past Medical History:  Diagnosis Date  . Allergy    seasonal  . Anemia   . Arthritis   . Complication of anesthesia    Anesthesia please look at MRI  Lumbar spine-results from 08/05/2013  . Heart murmur of newborn        Patient Active Problem List   Diagnosis Date Noted  . Overweight (BMI 25.0-29.9) 07/04/2017  . S/P right THA, AA 07/03/2017  . Elevated hemoglobin A1c 10/01/2014  . Urinary frequency 10/01/2014  . Low TSH level 10/01/2014  . Right hip pain 10/01/2014  . Lumbago due to displacement of intervertebral disc 10/01/2014  . Shift work sleep disorder 10/01/2014  . Female cystocele 10/01/2014  . Absolute anemia 09/11/2014  . Bradycardia 09/11/2014  . Compression fracture 09/11/2014  . Fracture of lumbar spine (HCC) 09/11/2014  . Cold sore 09/11/2014  . Cannot sleep 09/11/2014  . Personal history of urinary calculi 09/11/2014  . Asymptomatic postmenopausal status 09/11/2014    Past Surgical History:  Procedure Laterality Date  . COLONOSCOPY    . cyst on eyebrow     right  .  REFRACTIVE SURGERY  2008   Bil  . TOTAL HIP ARTHROPLASTY Right 07/03/2017   Procedure: RIGHT TOTAL HIP ARTHROPLASTY ANTERIOR APPROACH;  Surgeon: Olin, Matthew, MD;  Location: [facility];  Service: Orthopedics;  Laterality: Right;  70 mins    Family History        Family Status  Relation Name Status  . Mother  Alive  . Father  Deceased at age 83  . Sister  Alive  . Brother  Alive  . MGM  Deceased  . MGF  Deceased  . PGM  Deceased  . Neg Hx  (Not Specified)        Her family history includes Atrial fibrillation in her mother; Cancer in her maternal grandfather and maternal grandmother; Congestive Heart Failure in her paternal grandmother; Emphysema in her father; Fibroids in her sister; Heart disease in her mother; Heart failure in her paternal grandmother; Hypertension in her brother and mother; Parkinson's disease in her father; Skin cancer in her father; Stroke in her father; Ulcerative colitis in her mother. There is no history of Breast cancer.      No Known Allergies   Current Outpatient Medications:  .  Calcium Carb-Cholecalciferol (CALCIUM PLUS VITAMIN D3) 600-800 MG-UNIT TABS, Take 1 tablet by mouth. Take one pill 3 times a week, Disp: , Rfl:  .  Doxepin HCl 3 MG TABS, Take 1 tablet (3 mg total) by mouth at bedtime as needed., Disp: 90 tablet, Rfl: 1 .  ferrous sulfate (FERROUSUL) 325 (65 FE) MG tablet, Take 1 tablet (325 mg total) by mouth 3 (three) times daily with meals. (Patient not taking: Reported on 08/09/2018), Disp: , Rfl: 3 .  loratadine (CLARITIN) 10 MG tablet, Take 10 mg by mouth daily as needed. , Disp: , Rfl:  .  Multiple Vitamin (MULTIVITAMIN) tablet, Take 1 tablet by mouth. Take one pill 3 time a week, Disp: , Rfl:  .  omeprazole (PRILOSEC) 20 MG capsule, Take 20 mg by mouth daily as needed. , Disp: , Rfl:  .  valACYclovir (VALTREX) 1000 MG tablet, Take 1 tablet (1,000 mg total) by mouth 2 (two) times daily. (Patient taking differently: Take 1,000 mg by mouth as  needed. ), Disp: 60 tablet, Rfl: 4   Patient Care Team: Burnette, Jennifer M, PA-C as PCP - General (Physician Assistant)    Objective:    Vitals: BP (!) 156/86 (BP Location: Left Arm, Patient Position: Sitting, Cuff Size: Large)   Pulse 69   Temp 98.1 F (36.7 C) (Oral)   Resp 16   Ht 5' 4" (1.626 m)   Wt 171 lb 3.2 oz (77.7 kg)   BMI 29.39 kg/m      Vitals:   08/29/18 0916  BP: (!) 156/86  Pulse: 69  Resp: 16  Temp: 98.1 F (36.7 C)  TempSrc: Oral  Weight: 171 lb 3.2 oz (77.7 kg)  Height: 5' 4" (1.626 m)     Physical Exam Vitals signs reviewed.  Constitutional:      General: She is not in acute distress.    Appearance: Normal appearance. She is well-developed. She is not diaphoretic.  HENT:     Head: Normocephalic and atraumatic.     Right Ear: Hearing, tympanic membrane, ear canal and external ear normal.     Left Ear: Hearing, tympanic membrane, ear canal and external ear normal.     Nose: Nose normal.     Mouth/Throat:     Mouth: Mucous membranes are moist.     Pharynx: Oropharynx is clear. Uvula midline. No oropharyngeal exudate.  Eyes:     General: No scleral icterus.       Right eye: No discharge.        Left eye: No discharge.     Extraocular Movements: Extraocular movements intact.     Conjunctiva/sclera: Conjunctivae normal.     Pupils: Pupils are equal, round, and reactive to light.  Neck:     Musculoskeletal: Normal range of motion and neck supple.     Thyroid: No thyromegaly.     Vascular: No carotid bruit or JVD.     Trachea: No tracheal deviation.  Cardiovascular:     Rate and Rhythm: Normal rate and regular rhythm.     Pulses: Normal pulses.     Heart sounds: Normal heart sounds. No murmur. No friction rub. No gallop.   Pulmonary:     Effort: Pulmonary effort is normal. No respiratory distress.     Breath sounds: Normal breath sounds. No wheezing or rales.  Chest:     Chest wall: No tenderness.     Breasts: Breasts are symmetrical.         Right: No inverted nipple, mass, nipple discharge, skin change or tenderness.        Left: No inverted nipple, mass, nipple discharge, skin change or tenderness.  Abdominal:     General: Bowel sounds are normal. There is no distension.     Palpations: Abdomen is soft. There is no mass.     Tenderness: There is no abdominal tenderness. There is no guarding or rebound.     Hernia: There is no hernia in the left inguinal area.  Genitourinary:    General: Normal vulva.     Exam position: Supine.     Labia:        Right: No rash, tenderness, lesion or injury.        Left: No rash, tenderness, lesion or injury.      Vagina: Normal. No signs of injury. No vaginal discharge, erythema, tenderness or bleeding.     Cervix: No cervical motion tenderness, discharge or friability.     Adnexa:        Right: No mass, tenderness or fullness.         Left: No mass, tenderness or fullness.       Rectum: Normal.     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        General: No tenderness.  Lymphadenopathy:  Cervical: No cervical adenopathy.  Skin:    General: Skin is warm and dry.     Findings: No rash.  Neurological:     Mental Status: She is alert and oriented to person, place, and time.     Cranial Nerves: No cranial nerve deficit.     Coordination: Coordination normal.     Deep Tendon Reflexes: Reflexes are normal and symmetric.  Psychiatric:        Mood and Affect: Mood normal.        Behavior: Behavior normal.        Thought Content: Thought content normal.        Judgment: Judgment normal.      Depression Screen PHQ 2/9 Scores 08/29/2018 10/17/2016 10/14/2015  PHQ - 2 Score 1 0 0       Assessment & Plan:     Routine Health Maintenance and Physical Exam  Exercise  Activities and Dietary recommendations Goals   None     Immunization History  Administered Date(s) Administered  . MMR 07/28/1992  . Td 10/14/2015  . Tdap 07/03/2005    Health Maintenance  Topic Date Due  . HIV Screening  07/15/1973  . PAP SMEAR-Modifier  09/30/2017  . MAMMOGRAM  12/07/2017  . INFLUENZA VACCINE  10/12/2018  . COLONOSCOPY  08/23/2023  . TETANUS/TDAP  10/13/2025  . Hepatitis C Screening  Completed     Discussed health benefits of physical activity, and encouraged her to engage in regular exercise appropriate for her age and condition.    1. Annual physical exam Normal exam. Labs normal. Up to date on vaccinations.  2. Breast cancer screening Breast exam today was normal. There is no family history of breast cancer. She does perform regular self breast exams. Mammogram was ordered as below. Information for [facility] was given to patient so she may schedule her mammogram at her convenience. - MM 3D SCREEN BREAST BILATERAL; Future  3. Cervical cancer screening Pap collected today. Will send as below and f/u pending results. - Cytology - PAP  --------------------------------------------------------------------    Jennifer M Burnette, PA-C  [facility]  

## 2018-08-29 ENCOUNTER — Encounter: Payer: Self-pay | Admitting: Physician Assistant

## 2018-08-29 ENCOUNTER — Encounter: Payer: Self-pay | Admitting: Internal Medicine

## 2018-08-29 ENCOUNTER — Ambulatory Visit (INDEPENDENT_AMBULATORY_CARE_PROVIDER_SITE_OTHER): Payer: 59 | Admitting: Physician Assistant

## 2018-08-29 ENCOUNTER — Other Ambulatory Visit (HOSPITAL_COMMUNITY)
Admission: RE | Admit: 2018-08-29 | Discharge: 2018-08-29 | Disposition: A | Discharge status: Home / Self Care | Payer: 59 | Source: Ambulatory Visit | Attending: Physician Assistant | Admitting: Physician Assistant

## 2018-08-29 VITALS — BP 156/86 | HR 69 | Temp 98.1°F | Resp 16 | Ht 64.0 in | Wt 171.2 lb

## 2018-08-29 DIAGNOSIS — Z0000 Encounter for general adult medical examination without abnormal findings: Secondary | ICD-10-CM

## 2018-08-29 DIAGNOSIS — Z1239 Encounter for other screening for malignant neoplasm of breast: Secondary | ICD-10-CM

## 2018-08-29 DIAGNOSIS — Z124 Encounter for screening for malignant neoplasm of cervix: Secondary | ICD-10-CM

## 2018-08-29 NOTE — Patient Instructions (Signed)
Health Maintenance for Postmenopausal Women Menopause is a normal process in which your reproductive ability comes to an end. This process happens gradually over a span of months to years, usually between the ages of 62 and 89. Menopause is complete when you have missed 12 consecutive menstrual periods. It is important to talk with your health care provider about some of the most common conditions that affect postmenopausal women, such as heart disease, cancer, and bone loss (osteoporosis). Adopting a healthy lifestyle and getting preventive care can help to promote your health and wellness. Those actions can also lower your chances of developing some of these common conditions. What should I know about menopause? During menopause, you may experience a number of symptoms, such as:  Moderate-to-severe hot flashes.  Night sweats.  Decrease in sex drive.  Mood swings.  Headaches.  Tiredness.  Irritability.  Memory problems.  Insomnia. Choosing to treat or not to treat menopausal changes is an individual decision that you make with your health care provider. What should I know about hormone replacement therapy and supplements? Hormone therapy products are effective for treating symptoms that are associated with menopause, such as hot flashes and night sweats. Hormone replacement carries certain risks, especially as you become older. If you are thinking about using estrogen or estrogen with progestin treatments, discuss the benefits and risks with your health care provider. What should I know about heart disease and stroke? Heart disease, heart attack, and stroke become more likely as you age. This may be due, in part, to the hormonal changes that your body experiences during menopause. These can affect how your body processes dietary fats, triglycerides, and cholesterol. Heart attack and stroke are both medical emergencies. There are many things that you can do to help prevent heart disease  and stroke:  Have your blood pressure checked at least every 1-2 years. High blood pressure causes heart disease and increases the risk of stroke.  If you are 79-72 years old, ask your health care provider if you should take aspirin to prevent a heart attack or a stroke.  Do not use any tobacco products, including cigarettes, chewing tobacco, or electronic cigarettes. If you need help quitting, ask your health care provider.  It is important to eat a healthy diet and maintain a healthy weight. ? Be sure to include plenty of vegetables, fruits, low-fat dairy products, and lean protein. ? Avoid eating foods that are high in solid fats, added sugars, or salt (sodium).  Get regular exercise. This is one of the most important things that you can do for your health. ? Try to exercise for at least 150 minutes each week. The type of exercise that you do should increase your heart rate and make you sweat. This is known as moderate-intensity exercise. ? Try to do strengthening exercises at least twice each week. Do these in addition to the moderate-intensity exercise.  Know your numbers.Ask your health care provider to check your cholesterol and your blood glucose. Continue to have your blood tested as directed by your health care provider.  What should I know about cancer screening? There are several types of cancer. Take the following steps to reduce your risk and to catch any cancer development as early as possible. Breast Cancer  Practice breast self-awareness. ? This means understanding how your breasts normally appear and feel. ? It also means doing regular breast self-exams. Let your health care provider know about any changes, no matter how small.  If you are 40 or  older, have a clinician do a breast exam (clinical breast exam or CBE) every year. Depending on your age, family history, and medical history, it may be recommended that you also have a yearly breast X-ray (mammogram).  If you  have a family history of breast cancer, talk with your health care provider about genetic screening.  If you are at high risk for breast cancer, talk with your health care provider about having an MRI and a mammogram every year.  Breast cancer (BRCA) gene test is recommended for women who have family members with BRCA-related cancers. Results of the assessment will determine the need for genetic counseling and BRCA1 and for BRCA2 testing. BRCA-related cancers include these types: ? Breast. This occurs in males or females. ? Ovarian. ? Tubal. This may also be called fallopian tube cancer. ? Cancer of the abdominal or pelvic lining (peritoneal cancer). ? Prostate. ? Pancreatic. Cervical, Uterine, and Ovarian Cancer Your health care provider may recommend that you be screened regularly for cancer of the pelvic organs. These include your ovaries, uterus, and vagina. This screening involves a pelvic exam, which includes checking for microscopic changes to the surface of your cervix (Pap test).  For women ages 21-65, health care providers may recommend a pelvic exam and a Pap test every three years. For women ages 39-65, they may recommend the Pap test and pelvic exam, combined with testing for human papilloma virus (HPV), every five years. Some types of HPV increase your risk of cervical cancer. Testing for HPV may also be done on women of any age who have unclear Pap test results.  Other health care providers may not recommend any screening for nonpregnant women who are considered low risk for pelvic cancer and have no symptoms. Ask your health care provider if a screening pelvic exam is right for you.  If you have had past treatment for cervical cancer or a condition that could lead to cancer, you need Pap tests and screening for cancer for at least 20 years after your treatment. If Pap tests have been discontinued for you, your risk factors (such as having a new sexual partner) need to be reassessed  to determine if you should start having screenings again. Some women have medical problems that increase the chance of getting cervical cancer. In these cases, your health care provider may recommend that you have screening and Pap tests more often.  If you have a family history of uterine cancer or ovarian cancer, talk with your health care provider about genetic screening.  If you have vaginal bleeding after reaching menopause, tell your health care provider.  There are currently no reliable tests available to screen for ovarian cancer. Lung Cancer Lung cancer screening is recommended for adults 57-50 years old who are at high risk for lung cancer because of a history of smoking. A yearly low-dose CT scan of the lungs is recommended if you:  Currently smoke.  Have a history of at least 30 pack-years of smoking and you currently smoke or have quit within the past 15 years. A pack-year is smoking an average of one pack of cigarettes per day for one year. Yearly screening should:  Continue until it has been 15 years since you quit.  Stop if you develop a health problem that would prevent you from having lung cancer treatment. Colorectal Cancer  This type of cancer can be detected and can often be prevented.  Routine colorectal cancer screening usually begins at age 12 and continues through  age 63.  If you have risk factors for colon cancer, your health care provider may recommend that you be screened at an earlier age.  If you have a family history of colorectal cancer, talk with your health care provider about genetic screening.  Your health care provider may also recommend using home test kits to check for hidden blood in your stool.  A small camera at the end of a tube can be used to examine your colon directly (sigmoidoscopy or colonoscopy). This is done to check for the earliest forms of colorectal cancer.  Direct examination of the colon should be repeated every 5-10 years until  age 75. However, if early forms of precancerous polyps or small growths are found or if you have a family history or genetic risk for colorectal cancer, you may need to be screened more often. Skin Cancer  Check your skin from head to toe regularly.  Monitor any moles. Be sure to tell your health care provider: ? About any new moles or changes in moles, especially if there is a change in a mole's shape or color. ? If you have a mole that is larger than the size of a pencil eraser.  If any of your family members has a history of skin cancer, especially at a young age, talk with your health care provider about genetic screening.  Always use sunscreen. Apply sunscreen liberally and repeatedly throughout the day.  Whenever you are outside, protect yourself by wearing long sleeves, pants, a wide-brimmed hat, and sunglasses. What should I know about osteoporosis? Osteoporosis is a condition in which bone destruction happens more quickly than new bone creation. After menopause, you may be at an increased risk for osteoporosis. To help prevent osteoporosis or the bone fractures that can happen because of osteoporosis, the following is recommended:  If you are 59-59 years old, get at least 1,000 mg of calcium and at least 600 mg of vitamin D per day.  If you are older than age 36 but younger than age 32, get at least 1,200 mg of calcium and at least 600 mg of vitamin D per day.  If you are older than age 47, get at least 1,200 mg of calcium and at least 800 mg of vitamin D per day. Smoking and excessive alcohol intake increase the risk of osteoporosis. Eat foods that are rich in calcium and vitamin D, and do weight-bearing exercises several times each week as directed by your health care provider. What should I know about how menopause affects my mental health? Depression may occur at any age, but it is more common as you become older. Common symptoms of depression include:  Low or sad mood.   Changes in sleep patterns.  Changes in appetite or eating patterns.  Feeling an overall lack of motivation or enjoyment of activities that you previously enjoyed.  Frequent crying spells. Talk with your health care provider if you think that you are experiencing depression. What should I know about immunizations? It is important that you get and maintain your immunizations. These include:  Tetanus, diphtheria, and pertussis (Tdap) booster vaccine.  Influenza every year before the flu season begins.  Pneumonia vaccine.  Shingles vaccine. Your health care provider may also recommend other immunizations. This information is not intended to replace advice given to you by your health care provider. Make sure you discuss any questions you have with your health care provider. Document Released: 04/21/2005 Document Revised: 09/17/2015 Document Reviewed: 12/01/2014 Elsevier Interactive Patient Education  2019 [place].

## 2018-09-03 ENCOUNTER — Telehealth: Payer: Self-pay | Admitting: Physician Assistant

## 2018-09-03 LAB — CYTOLOGY - PAP
Diagnosis: NEGATIVE
HPV: NOT DETECTED

## 2018-09-03 NOTE — Telephone Encounter (Signed)
-----   Message from Mar Daring, [place] sent at 09/03/2018  1:32 PM EDT ----- Pap is normal, HPV negative.  Will repeat in 3-5 years.

## 2018-09-03 NOTE — Telephone Encounter (Signed)
Spoke with pt to relay this message. Pt understood.  °TF °

## 2018-09-27 ENCOUNTER — Encounter: Payer: Self-pay | Admitting: Physician Assistant

## 2018-10-08 ENCOUNTER — Telehealth: Payer: Self-pay | Admitting: Physician Assistant

## 2018-10-08 NOTE — Telephone Encounter (Signed)
Pt emailed below message.  I encouraged the patient to use MyChart.    I was in [facility] on 6/18 to see Rubbie Battiest for my annual physical.  I had routine lab work the day before.   The labs were coded as "medical diagnosis assigned as monitoring a health condition".  I do not having any health conditions that these were monitoring, so I do not understand the "coding".  Can this please be checked?

## 2018-10-17 NOTE — Telephone Encounter (Signed)
Sara Figueroa,  Patient had labs drawn week prior to CPE for her yearly preventative labs, but because it was prior to the CPE I used her medical diagnoses to get covered since it was not her CPE visit. Is this something she should contact the billing department for?

## 2018-10-18 NOTE — Telephone Encounter (Signed)
Can someone help with this? I think she had labs at [place]  We need to add code [email or web address] please.

## 2018-10-18 NOTE — Telephone Encounter (Signed)
LMTCB

## 2018-10-21 NOTE — Telephone Encounter (Signed)
I called [facility] on Friday and person with [facility] billing stated no account pending for this patient.

## 2018-10-23 NOTE — Telephone Encounter (Signed)
I also left patient a message if she can take a picture of the bill and send the picture through my chart. 10/18/18

## 2019-03-11 DIAGNOSIS — H5212 Myopia, left eye: Secondary | ICD-10-CM | POA: Diagnosis not present

## 2019-03-11 DIAGNOSIS — H5201 Hypermetropia, right eye: Secondary | ICD-10-CM | POA: Diagnosis not present

## 2019-03-11 DIAGNOSIS — H524 Presbyopia: Secondary | ICD-10-CM | POA: Diagnosis not present

## 2019-03-11 DIAGNOSIS — H52223 Regular astigmatism, bilateral: Secondary | ICD-10-CM | POA: Diagnosis not present

## 2020-02-20 DIAGNOSIS — H524 Presbyopia: Secondary | ICD-10-CM | POA: Diagnosis not present

## 2020-02-20 DIAGNOSIS — H5212 Myopia, left eye: Secondary | ICD-10-CM | POA: Diagnosis not present

## 2020-02-20 DIAGNOSIS — H52223 Regular astigmatism, bilateral: Secondary | ICD-10-CM | POA: Diagnosis not present

## 2020-02-20 DIAGNOSIS — H5201 Hypermetropia, right eye: Secondary | ICD-10-CM | POA: Diagnosis not present

## 2020-07-30 ENCOUNTER — Ambulatory Visit (INDEPENDENT_AMBULATORY_CARE_PROVIDER_SITE_OTHER): Payer: 59 | Admitting: Family Medicine

## 2020-07-30 ENCOUNTER — Other Ambulatory Visit: Payer: Self-pay

## 2020-07-30 ENCOUNTER — Encounter: Payer: Self-pay | Admitting: Family Medicine

## 2020-07-30 VITALS — BP 135/75 | HR 73 | Ht 64.5 in | Wt 177.0 lb

## 2020-07-30 DIAGNOSIS — B009 Herpesviral infection, unspecified: Secondary | ICD-10-CM | POA: Diagnosis not present

## 2020-07-30 DIAGNOSIS — Z136 Encounter for screening for cardiovascular disorders: Secondary | ICD-10-CM | POA: Diagnosis not present

## 2020-07-30 DIAGNOSIS — Z13220 Encounter for screening for lipoid disorders: Secondary | ICD-10-CM | POA: Diagnosis not present

## 2020-07-30 DIAGNOSIS — Z1231 Encounter for screening mammogram for malignant neoplasm of breast: Secondary | ICD-10-CM

## 2020-07-30 DIAGNOSIS — Z0000 Encounter for general adult medical examination without abnormal findings: Secondary | ICD-10-CM | POA: Diagnosis not present

## 2020-07-30 DIAGNOSIS — B001 Herpesviral vesicular dermatitis: Secondary | ICD-10-CM

## 2020-07-30 MED ORDER — VALACYCLOVIR HCL 1 G PO TABS
1000.0000 mg | ORAL_TABLET | Freq: Two times a day (BID) | ORAL | 4 refills | Status: DC
  Filled 2020-07-30: qty 180, 90d supply, fill #0
  Filled 2020-10-11: qty 120, 60d supply, fill #1

## 2020-07-30 NOTE — Progress Notes (Signed)
Complete physical exam   Patient: Sara Figueroa   DOB: 1960-06-26   60 y.o. Female  MRN: [id number] Visit Date: 07/30/2020  Today's healthcare provider: Dortha Kern, PA-C   Chief Complaint  Patient presents with  . Annual Exam   Subjective    Sara Figueroa is a 60 y.o. female who presents today for a complete physical exam.  She reports consuming a general diet.  She generally feels well. She reports sleeping well. She does not have additional problems to discuss today.    Past Medical History:  Diagnosis Date  . Allergy    seasonal  . Anemia   . Arthritis   . Complication of anesthesia    Anesthesia please look at MRI  Lumbar spine-results from 08/05/2013  . Heart murmur of newborn    Past Surgical History:  Procedure Laterality Date  . COLONOSCOPY    . cyst on eyebrow     right  . REFRACTIVE SURGERY  2008   [place]  . TOTAL HIP ARTHROPLASTY Right 07/03/2017   Procedure: RIGHT TOTAL HIP ARTHROPLASTY ANTERIOR APPROACH;  Surgeon: Durene Romans, MD;  Location: [facility];  Service: Orthopedics;  Laterality: Right;  70 mins   Social History   Socioeconomic History  . Marital status: Married    Spouse name: Not on file  . Number of children: Not on file  . Years of education: Not on file  . Highest education level: Not on file  Occupational History  . Not on file  Tobacco Use  . Smoking status: Former Smoker    Packs/day: 0.50    Years: 20.00    Pack years: 10.00    Types: Cigarettes  . Smokeless tobacco: Never Used  . Tobacco comment: quit in 2006  Vaping Use  . Vaping Use: Never used  Substance and Sexual Activity  . Alcohol use: Yes    Alcohol/week: 3.0 - 4.0 standard drinks    Types: 3 - 4 Glasses of wine per week    Comment: DRINKS 1 GLASS OF WINE EVERY OTHER NIGHT  . Drug use: No  . Sexual activity: Not on file  Other Topics Concern  . Not on file  Social History Narrative  . Not on file   Social Determinants of Health   Financial  Resource Strain: Not on file  Food Insecurity: Not on file  Transportation Needs: Not on file  Physical Activity: Not on file  Stress: Not on file  Social Connections: Not on file  Intimate Partner Violence: Not on file   Family Status  Relation Name Status  . Mother  Alive  . Father  Deceased at age 49  . Sister  Alive  . Brother  Alive  . MGM  Deceased  . MGF  Deceased  . PGM  Deceased  . PGF  Deceased  . Niece Maternal Alive  . Neg Hx  (Not Specified)   Family History  Problem Relation Age of Onset  . Heart disease Mother   . Hypertension Mother   . Ulcerative colitis Mother   . Atrial fibrillation Mother   . Alzheimer's disease Mother   . Emphysema Father   . Parkinson's disease Father   . Skin cancer Father   . Stroke Father   . Fibroids Sister   . Hypertension Brother   . Cancer Maternal Grandmother   . Cancer Maternal Grandfather   . Congestive Heart Failure Paternal Grandmother   . Heart failure Paternal Grandmother   .  Diabetes Paternal Grandfather   . Colon cancer Niece   . Breast cancer Neg Hx    No Known Allergies   Patient Care Team: Adana Marik, Vickki Muff, PA-C as PCP - General (Family Medicine)   Medications: Outpatient Medications Prior to Visit  Medication Sig  . Calcium Carb-Cholecalciferol 600-800 MG-UNIT TABS Take 1 tablet by mouth. Take one pill 3 times a week  . Doxepin HCl 3 MG TABS Take 1 tablet (3 mg total) by mouth at bedtime as needed.  . ferrous sulfate 325 (65 FE) MG tablet Take 325 mg by mouth. 1 Tablet three times a week.  . loratadine (CLARITIN) 10 MG tablet Take 10 mg by mouth daily as needed.   . Multiple Vitamin (MULTIVITAMIN) tablet Take 1 tablet by mouth. Take one pill 3 time a week  . omeprazole (PRILOSEC) 20 MG capsule Take 20 mg by mouth daily as needed.   . valACYclovir (VALTREX) 1000 MG tablet Take 1 tablet (1,000 mg total) by mouth 2 (two) times daily. (Patient taking differently: Take 1,000 mg by mouth as needed.)  .  ferrous sulfate (FERROUSUL) 325 (65 FE) MG tablet Take 1 tablet (325 mg total) by mouth 3 (three) times daily with meals. (Patient taking differently: Take 325 mg by mouth. 1 Tablet three times a week.)   No facility-administered medications prior to visit.    Review of Systems  Constitutional: Negative.   HENT: Negative.   Eyes: Negative.   Respiratory: Negative.   Cardiovascular: Negative.   Gastrointestinal: Negative.   Endocrine: Negative.   Genitourinary: Negative.  Negative for dysuria.  Musculoskeletal: Negative.   Skin: Negative.   Allergic/Immunologic: Positive for environmental allergies. Negative for food allergies and immunocompromised state.  Neurological: Negative.   Hematological: Negative.   Psychiatric/Behavioral: Negative.     Objective    BP 135/75 (BP Location: Right Arm, Patient Position: Sitting, Cuff Size: Large)   Pulse 73   Ht 5' 4.5" (1.638 m)   Wt 177 lb (80.3 kg)   BMI 29.91 kg/m    Physical Exam Constitutional:      Appearance: She is well-developed.  HENT:     Head: Normocephalic and atraumatic.     Right Ear: External ear normal.     Left Ear: External ear normal.     Nose: Nose normal.  Eyes:     General:        Right eye: No discharge.     Conjunctiva/sclera: Conjunctivae normal.     Pupils: Pupils are equal, round, and reactive to light.  Neck:     Thyroid: No thyromegaly.     Trachea: No tracheal deviation.  Cardiovascular:     Rate and Rhythm: Normal rate and regular rhythm.     Heart sounds: Normal heart sounds. No murmur heard.   Pulmonary:     Effort: Pulmonary effort is normal. No respiratory distress.     Breath sounds: Normal breath sounds. No wheezing or rales.  Chest:     Chest wall: No tenderness.  Abdominal:     General: There is no distension.     Palpations: Abdomen is soft. There is no mass.     Tenderness: There is no abdominal tenderness. There is no guarding or rebound.  Musculoskeletal:        General:  No tenderness. Normal range of motion.     Cervical back: Normal range of motion and neck supple.  Lymphadenopathy:     Cervical: No cervical adenopathy.  Skin:  General: Skin is warm and dry.     Findings: No erythema or rash.  Neurological:     Mental Status: She is alert and oriented to person, place, and time.     Cranial Nerves: No cranial nerve deficit.     Motor: No abnormal muscle tone.     Coordination: Coordination normal.     Deep Tendon Reflexes: Reflexes are normal and symmetric. Reflexes normal.  Psychiatric:        Behavior: Behavior normal.        Thought Content: Thought content normal.        Judgment: Judgment normal.       Last depression screening scores PHQ 2/9 Scores 08/29/2018 10/17/2016 10/14/2015  PHQ - 2 Score 1 0 0   Last fall risk screening Fall Risk  10/17/2016  Falls in the past year? No   Last Audit-C alcohol use screening Alcohol Use Disorder Test (AUDIT) 08/29/2018  1. How often do you have a drink containing alcohol? 3  2. How many drinks containing alcohol do you have on a typical day when you are drinking? 0  3. How often do you have six or more drinks on one occasion? 1  AUDIT-C Score 4  Alcohol Brief Interventions/Follow-up AUDIT Score <7 follow-up not indicated   A score of 3 or more in women, and 4 or more in men indicates increased risk for alcohol abuse, EXCEPT if all of the points are from question 1   No results found for any visits on 07/30/20.  Assessment & Plan    Routine Health Maintenance and Physical Exam  Exercise Activities and Dietary recommendations Goals   Recommend exercise 30-40 minutes 3-4 days a week and lower caloric intake.     Immunization History  Administered Date(s) Administered  . MMR 07/28/1992  . Td 10/14/2015  . Tdap 07/03/2005    Health Maintenance  Topic Date Due  . MAMMOGRAM  12/07/2017  . INFLUENZA VACCINE  10/11/2020  . PAP SMEAR-Modifier  08/28/2021  . COLONOSCOPY (Pts 45-18yrs  Insurance coverage will need to be confirmed)  08/22/2025  . TETANUS/TDAP  10/13/2025  . Hepatitis C Screening  Completed  . HIV Screening  Completed  . HPV VACCINES  Aged Out    Discussed health benefits of physical activity, and encouraged her to engage in regular exercise appropriate for her age and condition.  1. Annual physical exam Good general health. Check routine follow up labs. Should check with pharmacist regarding additional immunizations. - CBC with Differential/Platelet - Comprehensive metabolic panel - Lipid panel - TSH  2. Screening mammogram for breast cancer - MM 3D SCREEN BREAST BILATERAL  3. Encounter for lipid screening for cardiovascular disease - CBC with Differential/Platelet - Comprehensive metabolic panel - Lipid panel - TSH   No follow-ups on file.     I, Yeraldin Litzenberger, PA-C, have reviewed all documentation for this visit. The documentation on 07/30/20 for the exam, diagnosis, procedures, and orders are all accurate and complete.    Vernie Murders, PA-C  [employer] [phone] (phone) [phone] (fax)  [place]

## 2020-07-31 LAB — COMPREHENSIVE METABOLIC PANEL
ALT: 16 IU/L (ref 0–32)
AST: 16 IU/L (ref 0–40)
Albumin/Globulin Ratio: 2 (ref 1.2–2.2)
Albumin: 4.8 g/dL (ref 3.8–4.8)
Alkaline Phosphatase: 74 IU/L (ref 44–121)
BUN/Creatinine Ratio: 22 (ref 12–28)
BUN: 17 mg/dL (ref 8–27)
Bilirubin Total: 0.5 mg/dL (ref 0.0–1.2)
CO2: 20 mmol/L (ref 20–29)
Calcium: 10.2 mg/dL (ref 8.7–10.3)
Chloride: 102 mmol/L (ref 96–106)
Creatinine, Ser: 0.79 mg/dL (ref 0.57–1.00)
Globulin, Total: 2.4 g/dL (ref 1.5–4.5)
Glucose: 90 mg/dL (ref 65–99)
Potassium: 4.7 mmol/L (ref 3.5–5.2)
Sodium: 139 mmol/L (ref 134–144)
Total Protein: 7.2 g/dL (ref 6.0–8.5)
eGFR: 85 mL/min/{1.73_m2} (ref >59)

## 2020-07-31 LAB — CBC WITH DIFFERENTIAL/PLATELET
Basophils Absolute: 0.1 10*3/uL (ref 0.0–0.2)
Basos: 1 %
EOS (ABSOLUTE): 0.2 10*3/uL (ref 0.0–0.4)
Eos: 2 %
Hematocrit: 41.1 % (ref 34.0–46.6)
Hemoglobin: 13.7 g/dL (ref 11.1–15.9)
Immature Grans (Abs): 0 10*3/uL (ref 0.0–0.1)
Immature Granulocytes: 0 %
Lymphocytes Absolute: 1.9 10*3/uL (ref 0.7–3.1)
Lymphs: 25 %
MCH: 30 pg (ref 26.6–33.0)
MCHC: 33.3 g/dL (ref 31.5–35.7)
MCV: 90 fL (ref 79–97)
Monocytes Absolute: 0.7 10*3/uL (ref 0.1–0.9)
Monocytes: 10 %
Neutrophils Absolute: 4.8 10*3/uL (ref 1.4–7.0)
Neutrophils: 62 %
Platelets: 286 10*3/uL (ref 150–450)
RBC: 4.56 x10E6/uL (ref 3.77–5.28)
RDW: 12.1 % (ref 11.7–15.4)
WBC: 7.6 10*3/uL (ref 3.4–10.8)

## 2020-07-31 LAB — TSH: TSH: 0.764 u[IU]/mL (ref 0.450–4.500)

## 2020-07-31 LAB — LIPID PANEL
Chol/HDL Ratio: 2.2 ratio (ref 0.0–4.4)
Cholesterol, Total: 213 mg/dL — ABNORMAL HIGH (ref 100–199)
HDL: 96 mg/dL (ref >39)
LDL Chol Calc (NIH): 102 mg/dL — ABNORMAL HIGH (ref 0–99)
Triglycerides: 88 mg/dL (ref 0–149)
VLDL Cholesterol Cal: 15 mg/dL (ref 5–40)

## 2020-09-02 ENCOUNTER — Other Ambulatory Visit: Payer: Self-pay

## 2020-09-02 MED ORDER — CARESTART COVID-19 HOME TEST VI KIT
PACK | 0 refills | Status: DC
  Filled 2020-09-02: qty 2, 4d supply, fill #0

## 2020-10-11 ENCOUNTER — Other Ambulatory Visit: Payer: Self-pay

## 2020-10-11 ENCOUNTER — Ambulatory Visit
Admission: RE | Admit: 2020-10-11 | Discharge: 2020-10-11 | Disposition: A | Discharge status: Home / Self Care | Payer: 59 | Source: Ambulatory Visit | Attending: Family Medicine | Admitting: Family Medicine

## 2020-10-11 DIAGNOSIS — Z1231 Encounter for screening mammogram for malignant neoplasm of breast: Secondary | ICD-10-CM | POA: Diagnosis not present

## 2020-10-12 ENCOUNTER — Other Ambulatory Visit: Payer: Self-pay

## 2020-10-12 DIAGNOSIS — H5213 Myopia, bilateral: Secondary | ICD-10-CM | POA: Diagnosis not present

## 2020-10-15 ENCOUNTER — Other Ambulatory Visit: Payer: Self-pay

## 2020-10-15 MED ORDER — CARESTART COVID-19 HOME TEST VI KIT
PACK | 0 refills | Status: DC
  Filled 2020-10-15: qty 2, 4d supply, fill #0

## 2021-08-29 IMAGING — MG MM DIGITAL SCREENING BILAT W/ TOMO AND CAD
8 series · 8 of 24 positions shown · non-contrast
Comparison: Previous exam(s).

CLINICAL DATA: Screening.

EXAM:
DIGITAL SCREENING BILATERAL MAMMOGRAM WITH TOMOSYNTHESIS AND CAD
TECHNIQUE: Bilateral screening digital craniocaudal and mediolateral oblique
mammograms were obtained. Bilateral screening digital breast
tomosynthesis was performed. The images were evaluated with
computer-aided detection.

[L MLO synth-2D]
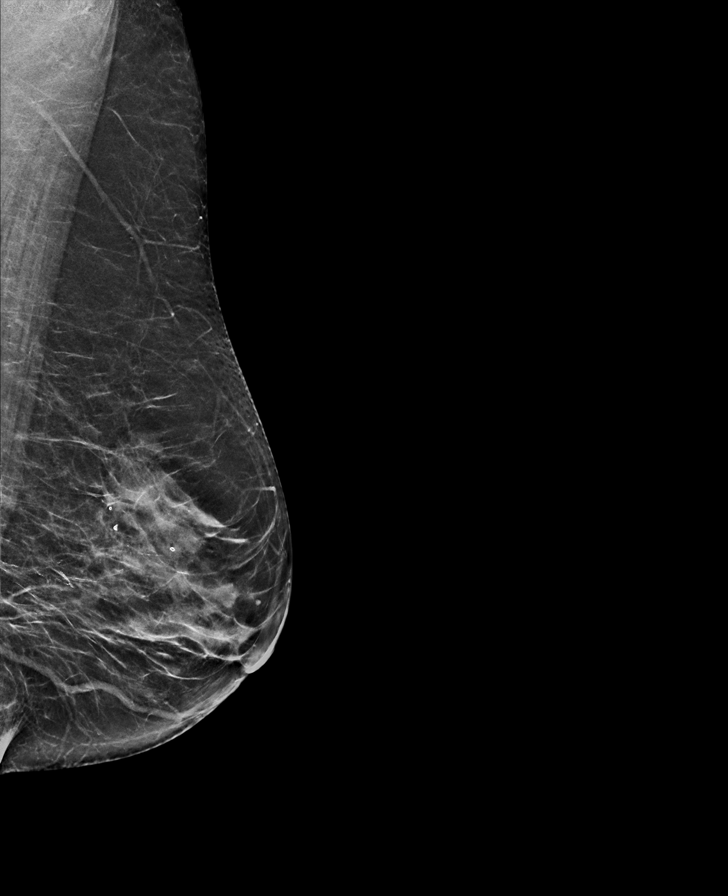

[L CC synth-2D]
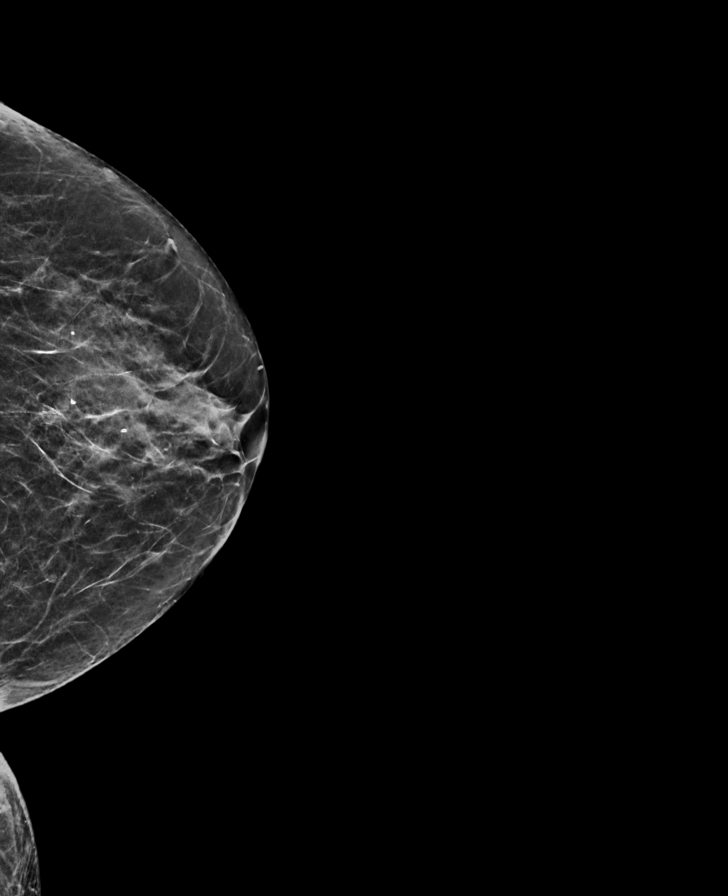

[R MLO synth-2D]
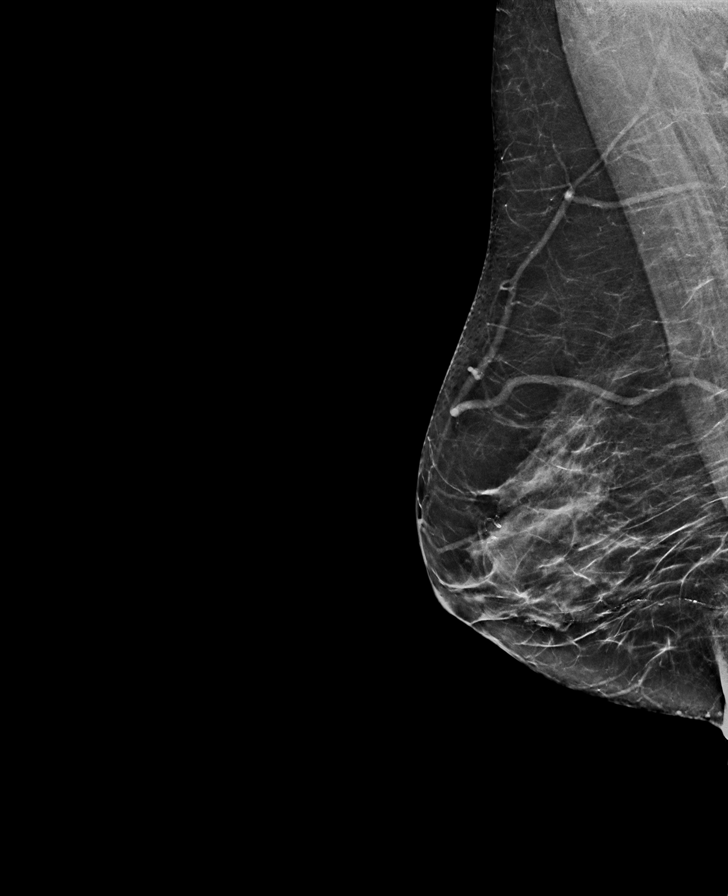

[R CC synth-2D]
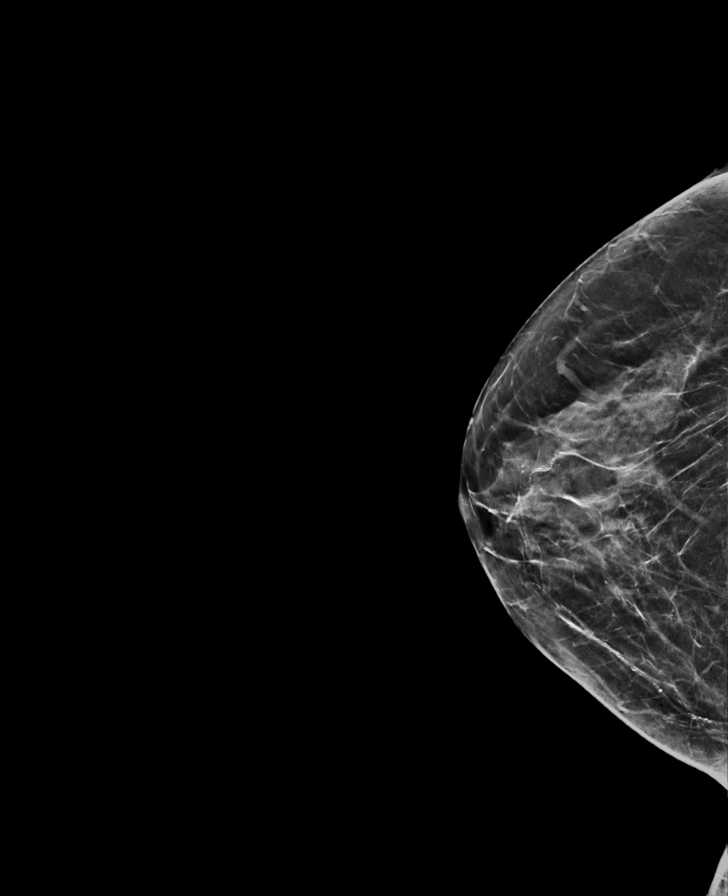

[R MLO tomo · tomo slice 31/60.0]
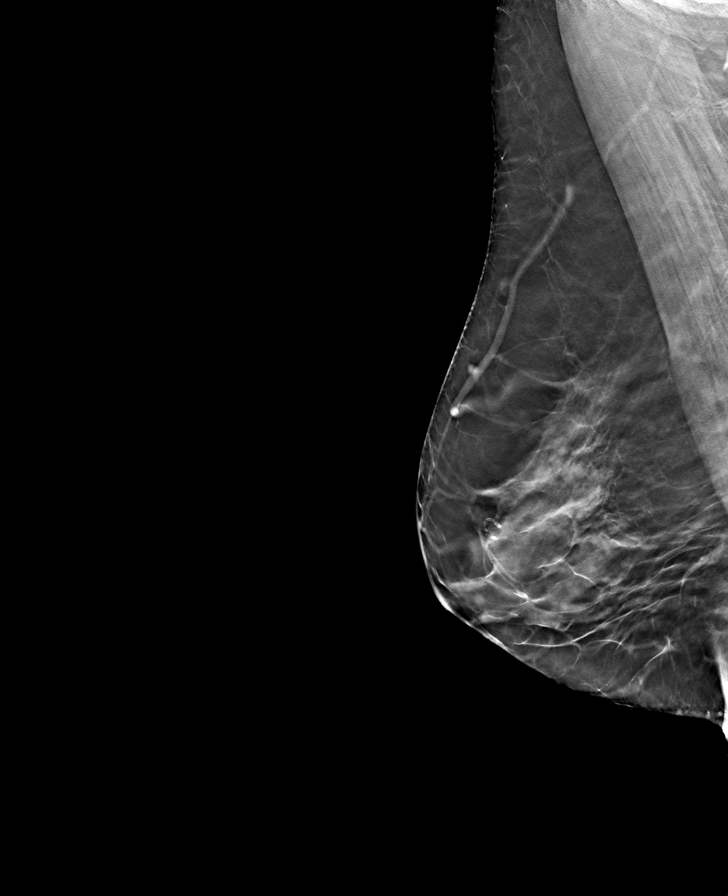

[L CC tomo · tomo slice 25/48.0]
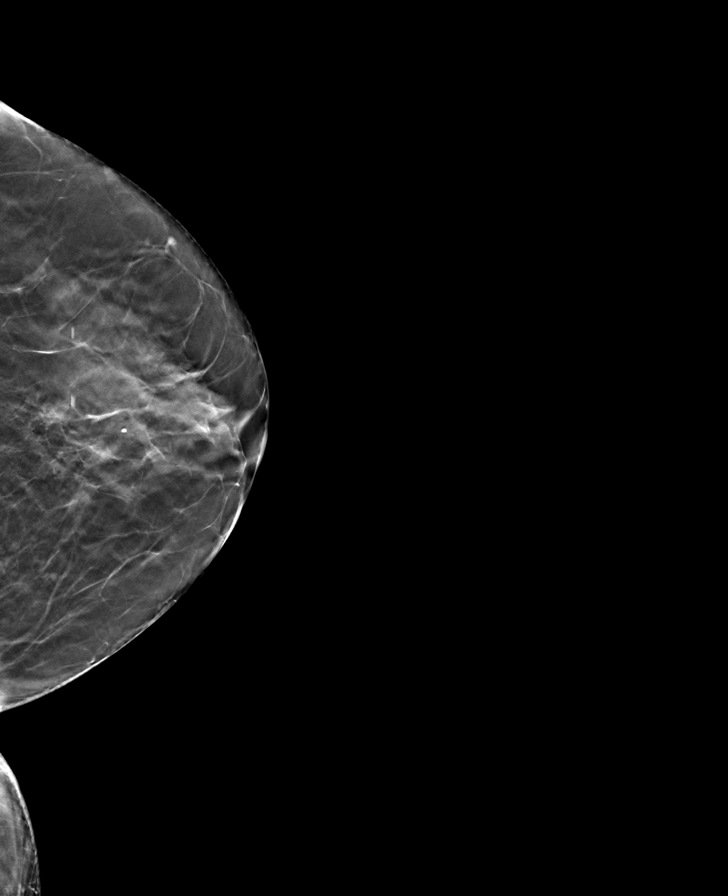

[R CC tomo · tomo slice 27/53.0]
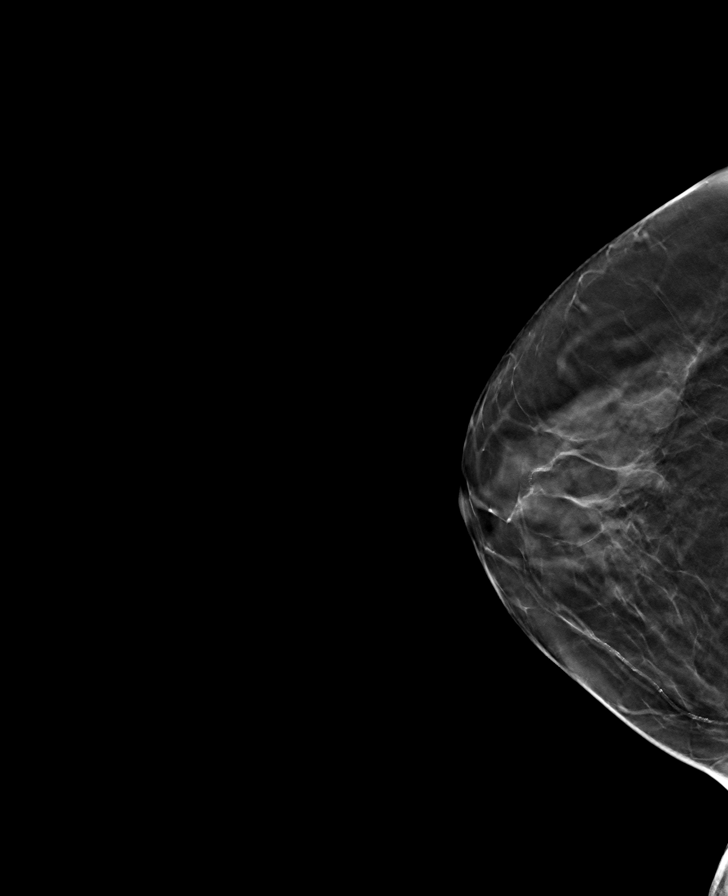

[L MLO tomo · tomo slice 29/57.0]
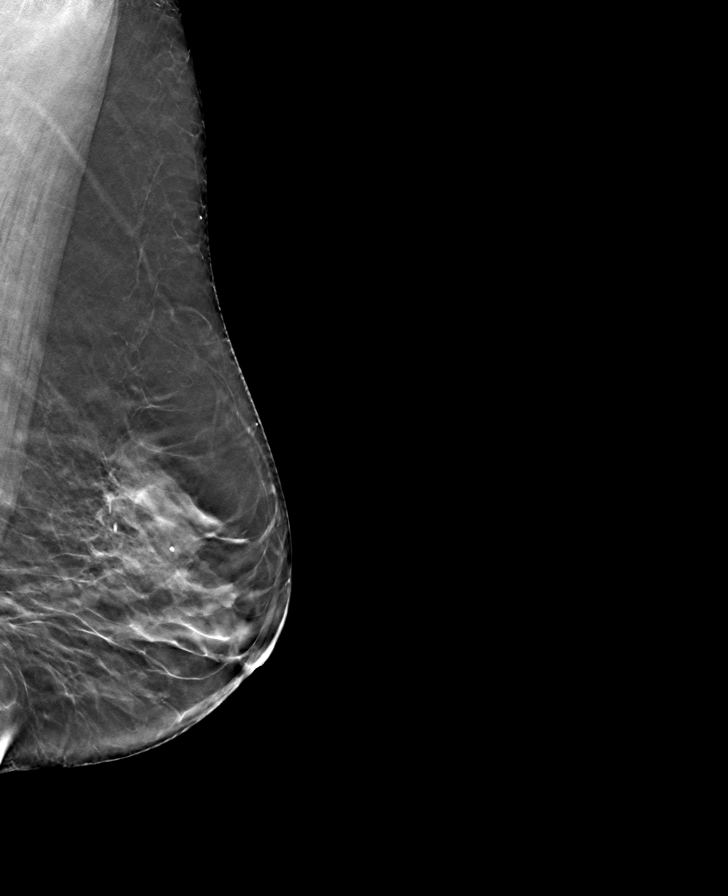

[8 of 24 positions shown; findings below may reference images not displayed]

ACR Breast Density Category c: The breast tissue is heterogeneously
dense, which may obscure small masses.
FINDINGS: There are no findings suspicious for malignancy.
IMPRESSION: No mammographic evidence of malignancy. A result letter of this
screening mammogram will be mailed directly to the patient.

RECOMMENDATION:
Screening mammogram in one year. (Code:Q3-W-BC3)

BI-RADS CATEGORY  1: Negative.

## 2021-10-07 ENCOUNTER — Ambulatory Visit (INDEPENDENT_AMBULATORY_CARE_PROVIDER_SITE_OTHER): Payer: 59 | Admitting: Family Medicine

## 2021-10-07 ENCOUNTER — Encounter: Payer: Self-pay | Admitting: Family Medicine

## 2021-10-07 ENCOUNTER — Other Ambulatory Visit: Payer: Self-pay

## 2021-10-07 VITALS — BP 126/79 | HR 51 | Temp 98.1°F | Ht 64.5 in | Wt 169.0 lb

## 2021-10-07 DIAGNOSIS — R030 Elevated blood-pressure reading, without diagnosis of hypertension: Secondary | ICD-10-CM | POA: Insufficient documentation

## 2021-10-07 DIAGNOSIS — R001 Bradycardia, unspecified: Secondary | ICD-10-CM | POA: Diagnosis not present

## 2021-10-07 DIAGNOSIS — Z0000 Encounter for general adult medical examination without abnormal findings: Secondary | ICD-10-CM | POA: Diagnosis not present

## 2021-10-07 DIAGNOSIS — Z833 Family history of diabetes mellitus: Secondary | ICD-10-CM | POA: Diagnosis not present

## 2021-10-07 DIAGNOSIS — B009 Herpesviral infection, unspecified: Secondary | ICD-10-CM

## 2021-10-07 DIAGNOSIS — E7800 Pure hypercholesterolemia, unspecified: Secondary | ICD-10-CM | POA: Insufficient documentation

## 2021-10-07 DIAGNOSIS — B001 Herpesviral vesicular dermatitis: Secondary | ICD-10-CM | POA: Insufficient documentation

## 2021-10-07 DIAGNOSIS — Z1231 Encounter for screening mammogram for malignant neoplasm of breast: Secondary | ICD-10-CM | POA: Insufficient documentation

## 2021-10-07 MED ORDER — VALACYCLOVIR HCL 1 G PO TABS
1000.0000 mg | ORAL_TABLET | Freq: Every day | ORAL | 3 refills | Status: DC
  Filled 2021-10-07: qty 90, 90d supply, fill #0
  Filled 2022-01-04: qty 90, 90d supply, fill #1
  Filled 2022-03-17: qty 90, 90d supply, fill #2
  Filled 2022-07-03: qty 90, 90d supply, fill #0

## 2021-10-07 NOTE — Patient Instructions (Signed)
The CDC recommends two doses of Shingrix (the new shingles vaccine) separated by 2 to 6 months for adults age 63 years and older. I recommend checking with your insurance plan regarding coverage for this vaccine.    

## 2021-10-07 NOTE — Progress Notes (Signed)
I,Sara Figueroa,acting as a scribe for Sara Kindle, FNP.,have documented all relevant documentation on the behalf of Sara Kindle, FNP,as directed by  Sara Kindle, FNP while in the presence of Sara Kindle, FNP.    Complete physical exam   Patient: Sara Figueroa   DOB: [birth year]   61 y.o. Female  MRN: [id number] Visit Date: 10/07/2021  Today's healthcare provider: [name], FNP  Patient presents for new patient visit to establish care.  Introduced to Publishing rights manager role and practice setting.  All questions answered.  Discussed provider/patient relationship and expectations.   Chief Complaint  Patient presents with   Annual Exam   Subjective    Sara Figueroa is a 61 y.o. female who presents today for a complete physical exam.  She reports consuming a general diet. Home exercise routine includes walking on the weekends. She generally feels fairly well. She reports sleeping fairly well. She does not have additional problems to discuss today.  HPI    Past Medical History:  Diagnosis Date   Allergy    seasonal   Anemia    Arthritis    Complication of anesthesia    Anesthesia please look at MRI  Lumbar spine-results from 08/05/2013   Heart murmur of newborn    Past Surgical History:  Procedure Laterality Date   COLONOSCOPY     cyst on eyebrow     right   REFRACTIVE SURGERY  2008   Bil   TOTAL HIP ARTHROPLASTY Right 07/03/2017   Procedure: RIGHT TOTAL HIP ARTHROPLASTY ANTERIOR APPROACH;  Surgeon: Durene Romans, MD;  Location: [facility];  Service: Orthopedics;  Laterality: Right;  70 mins   Social History   Socioeconomic History   Marital status: Married    Spouse name: Not on file   Number of children: Not on file   Years of education: Not on file   Highest education level: Not on file  Occupational History   Not on file  Tobacco Use   Smoking status: Former    Packs/day: 0.50    Years: 20.00    Total pack years: 10.00    Types: Cigarettes    Smokeless tobacco: Never   Tobacco comments:    quit in 2006  Vaping Use   Vaping Use: Never used  Substance and Sexual Activity   Alcohol use: Yes    Alcohol/week: 3.0 - 4.0 standard drinks of alcohol    Types: 3 - 4 Glasses of wine per week    Comment: DRINKS 1 GLASS OF WINE EVERY OTHER NIGHT   Drug use: No   Sexual activity: Not on file  Other Topics Concern   Not on file  Social History Narrative   Not on file   Social Determinants of Health   Financial Resource Strain: Not on file  Food Insecurity: Not on file  Transportation Needs: Not on file  Physical Activity: Not on file  Stress: Not on file  Social Connections: Not on file  Intimate Partner Violence: Not on file   Family Status  Relation Name Status   Mother  Alive   Father  Deceased at age 23   Sister  Alive   Brother  Alive   MGM  Deceased   MGF  Deceased   PGM  Deceased   PGF  Deceased   Niece Maternal Alive   Neg Hx  (Not Specified)   Family History  Problem Relation Age of Onset   Heart  disease Mother    Hypertension Mother    Ulcerative colitis Mother    Atrial fibrillation Mother    Alzheimer's disease Mother    Emphysema Father    Parkinson's disease Father    Skin cancer Father    Stroke Father    Fibroids Sister    Hypertension Brother    Cancer Maternal Grandmother    Cancer Maternal Grandfather    Congestive Heart Failure Paternal Grandmother    Heart failure Paternal Grandmother    Diabetes Paternal Grandfather    Colon cancer Niece    Breast cancer Neg Hx    No Known Allergies  Patient Care Team: Sara Kindle, FNP as PCP - General (Family Medicine)   Medications: Outpatient Medications Prior to Visit  Medication Sig   Calcium Carb-Cholecalciferol 600-800 MG-UNIT TABS Take 1 tablet by mouth. Take one pill 3 times a week   loratadine (CLARITIN) 10 MG tablet Take 10 mg by mouth daily as needed.    omeprazole (PRILOSEC) 20 MG capsule Take 20 mg by mouth daily as needed.     [DISCONTINUED] valACYclovir (VALTREX) 1000 MG tablet Take 1 tablet (1,000 mg total) by mouth 2 (two) times daily.   [DISCONTINUED] COVID-19 At Home Antigen Test [facility] COVID-19 HOME TEST) KIT Use as directed (Patient not taking: Reported on 10/07/2021)   [DISCONTINUED] Doxepin HCl 3 MG TABS Take 1 tablet (3 mg total) by mouth at bedtime as needed.   [DISCONTINUED] ferrous sulfate (FERROUSUL) 325 (65 FE) MG tablet Take 1 tablet (325 mg total) by mouth 3 (three) times daily with meals. (Patient taking differently: Take 325 mg by mouth. 1 Tablet three times a week.)   [DISCONTINUED] ferrous sulfate 325 (65 FE) MG tablet Take 325 mg by mouth. 1 Tablet three times a week.   No facility-administered medications prior to visit.    Review of Systems  Constitutional:  Negative for appetite change, chills, fatigue and fever.  Respiratory:  Negative for chest tightness and shortness of breath.   Cardiovascular:  Negative for chest pain and palpitations.  Gastrointestinal:  Negative for abdominal pain, nausea and vomiting.  Neurological:  Negative for dizziness and weakness.    Last CBC Lab Results  Component Value Date   WBC 7.3 10/07/2021   HGB 13.4 10/07/2021   HCT 42.0 10/07/2021   MCV 94 10/07/2021   MCH 30.1 10/07/2021   RDW 12.1 10/07/2021   PLT 264 10/07/2021   Last metabolic panel Lab Results  Component Value Date   GLUCOSE 98 10/07/2021   NA 141 10/07/2021   K 4.7 10/07/2021   CL 105 10/07/2021   CO2 21 10/07/2021   BUN 16 10/07/2021   CREATININE 0.84 10/07/2021   EGFR 78 10/07/2021   CALCIUM 10.0 10/07/2021   PROT 6.9 10/07/2021   ALBUMIN 4.5 10/07/2021   LABGLOB 2.4 10/07/2021   AGRATIO 1.9 10/07/2021   BILITOT 0.6 10/07/2021   ALKPHOS 65 10/07/2021   AST 11 10/07/2021   ALT 10 10/07/2021   ANIONGAP 7 08/28/2018   Last lipids Lab Results  Component Value Date   CHOL 215 (H) 10/07/2021   HDL 88 10/07/2021   LDLCALC 111 (H) 10/07/2021   TRIG 93 10/07/2021    CHOLHDL 2.4 10/07/2021   Last hemoglobin A1c Lab Results  Component Value Date   HGBA1C 5.4 10/07/2021   Last thyroid functions Lab Results  Component Value Date   TSH 0.575 10/07/2021    Objective     BP 126/79 (BP  Location: Left Arm, Patient Position: Sitting, Cuff Size: Large)   Pulse (!) 51   Temp 98.1 F (36.7 C) (Oral)   Ht 5' 4.5" (1.638 m)   Wt 169 lb (76.7 kg)   SpO2 98% Comment: room air  BMI 28.56 kg/m   BP Readings from Last 3 Encounters:  10/07/21 126/79  07/30/20 135/75  08/29/18 (!) 156/86   Wt Readings from Last 3 Encounters:  10/07/21 169 lb (76.7 kg)  07/30/20 177 lb (80.3 kg)  08/29/18 171 lb 3.2 oz (77.7 kg)   SpO2 Readings from Last 3 Encounters:  10/07/21 98%  08/23/18 100%  11/28/17 98%       Physical Exam Vitals and nursing note reviewed.  Constitutional:      General: She is awake. She is not in acute distress.    Appearance: Normal appearance. She is well-developed, well-groomed and overweight. She is not ill-appearing, toxic-appearing or diaphoretic.  HENT:     Head: Normocephalic and atraumatic.     Jaw: There is normal jaw occlusion. No trismus, tenderness, swelling or pain on movement.     Right Ear: Hearing, tympanic membrane, ear canal and external ear normal. There is no impacted cerumen.     Left Ear: Hearing, tympanic membrane, ear canal and external ear normal. There is no impacted cerumen.     Nose: Nose normal. No congestion or rhinorrhea.     Right Turbinates: Not enlarged, swollen or pale.     Left Turbinates: Not enlarged, swollen or pale.     Right Sinus: No maxillary sinus tenderness or frontal sinus tenderness.     Left Sinus: No maxillary sinus tenderness or frontal sinus tenderness.     Mouth/Throat:     Lips: Pink.     Mouth: Mucous membranes are moist. No injury.     Tongue: No lesions.     Pharynx: Oropharynx is clear. Uvula midline. No pharyngeal swelling, oropharyngeal exudate, posterior oropharyngeal  erythema or uvula swelling.     Tonsils: No tonsillar exudate or tonsillar abscesses.      Comments: Hyperpigmentation on uvula- f/b dental, unchanged in 10 years per pt report  Eyes:     General: Lids are normal. Lids are everted, no foreign bodies appreciated. Vision grossly intact. Gaze aligned appropriately. No allergic shiner or visual field deficit.       Right eye: No discharge.        Left eye: No discharge.     Extraocular Movements: Extraocular movements intact.     Conjunctiva/sclera: Conjunctivae normal.     Right eye: Right conjunctiva is not injected. No exudate.    Left eye: Left conjunctiva is not injected. No exudate.    Pupils: Pupils are equal, round, and reactive to light.  Neck:     Thyroid: No thyroid mass, thyromegaly or thyroid tenderness.     Vascular: No carotid bruit.     Trachea: Trachea normal.  Cardiovascular:     Rate and Rhythm: Normal rate and regular rhythm.     Pulses: Normal pulses.          Carotid pulses are 2+ on the right side and 2+ on the left side.      Radial pulses are 2+ on the right side and 2+ on the left side.       Dorsalis pedis pulses are 2+ on the right side and 2+ on the left side.       Posterior tibial pulses are 2+ on the right side  and 2+ on the left side.     Heart sounds: Normal heart sounds, S1 normal and S2 normal. No murmur heard.    No friction rub. No gallop.  Pulmonary:     Effort: Pulmonary effort is normal. No respiratory distress.     Breath sounds: Normal breath sounds and air entry. No stridor. No wheezing, rhonchi or rales.  Chest:     Chest wall: No tenderness.     Comments: Breasts: risk and benefit of breast self-exam was discussed, not examined, patient declines to have breast exam  Abdominal:     General: Abdomen is flat. Bowel sounds are normal. There is no distension.     Palpations: Abdomen is soft. There is no mass.     Tenderness: There is no abdominal tenderness. There is no right CVA tenderness,  left CVA tenderness, guarding or rebound.     Hernia: No hernia is present.  Genitourinary:    Comments: Exam deferred; denies complaints Musculoskeletal:        General: No swelling, tenderness, deformity or signs of injury. Normal range of motion.     Cervical back: Full passive range of motion without pain, normal range of motion and neck supple. No edema, rigidity or tenderness. No muscular tenderness.     Right lower leg: No edema.     Left lower leg: No edema.  Lymphadenopathy:     Cervical: No cervical adenopathy.     Right cervical: No superficial, deep or posterior cervical adenopathy.    Left cervical: No superficial, deep or posterior cervical adenopathy.  Skin:    General: Skin is warm and dry.     Capillary Refill: Capillary refill takes less than 2 seconds.     Coloration: Skin is not jaundiced or pale.     Findings: No bruising, erythema, lesion or rash.  Neurological:     General: No focal deficit present.     Mental Status: She is alert and oriented to person, place, and time. Mental status is at baseline.     GCS: GCS eye subscore is 4. GCS verbal subscore is 5. GCS motor subscore is 6.     Sensory: Sensation is intact. No sensory deficit.     Motor: Motor function is intact. No weakness.     Coordination: Coordination is intact. Coordination normal.     Gait: Gait is intact. Gait normal.  Psychiatric:        Attention and Perception: Attention and perception normal.        Mood and Affect: Mood and affect normal.        Speech: Speech normal.        Behavior: Behavior normal. Behavior is cooperative.        Thought Content: Thought content normal.        Cognition and Memory: Cognition and memory normal.        Judgment: Judgment normal.     Last depression screening scores    10/07/2021    8:52 AM 10/07/2021    8:34 AM 07/30/2020    1:44 PM  PHQ 2/9 Scores  PHQ - 2 Score 0 0 0  PHQ- 9 Score 0 0 1   Last fall risk screening    10/07/2021    8:53 AM   Fall Risk   Falls in the past year? 0  Number falls in past yr: 0  Injury with Fall? 0  Risk for fall due to : No Fall Risks  Follow up Falls  evaluation completed   Last Audit-C alcohol use screening    07/30/2020    1:44 PM  Alcohol Use Disorder Test (AUDIT)  1. How often do you have a drink containing alcohol? 3  2. How many drinks containing alcohol do you have on a typical day when you are drinking? 0  3. How often do you have six or more drinks on one occasion? 1  AUDIT-C Score 4   A score of 3 or more in women, and 4 or more in men indicates increased risk for alcohol abuse, EXCEPT if all of the points are from question 1   Results for orders placed or performed in visit on 10/07/21  Hemoglobin A1c  Result Value Ref Range   Hgb A1c MFr Bld 5.4 4.8 - 5.6 %   Est. average glucose Bld gHb Est-mCnc 108 mg/dL  Comprehensive Metabolic Panel (CMET)  Result Value Ref Range   Glucose 98 70 - 99 mg/dL   BUN 16 8 - 27 mg/dL   Creatinine, Ser 1.61 0.57 - 1.00 mg/dL   eGFR 78 >09 UE/AVW/0.98   BUN/Creatinine Ratio 19 12 - 28   Sodium 141 134 - 144 mmol/L   Potassium 4.7 3.5 - 5.2 mmol/L   Chloride 105 96 - 106 mmol/L   CO2 21 20 - 29 mmol/L   Calcium 10.0 8.7 - 10.3 mg/dL   Total Protein 6.9 6.0 - 8.5 g/dL   Albumin 4.5 3.9 - 4.9 g/dL   Globulin, Total 2.4 1.5 - 4.5 g/dL   Albumin/Globulin Ratio 1.9 1.2 - 2.2   Bilirubin Total 0.6 0.0 - 1.2 mg/dL   Alkaline Phosphatase 65 44 - 121 IU/L   AST 11 0 - 40 IU/L   ALT 10 0 - 32 IU/L  CBC with Differential/Platelet  Result Value Ref Range   WBC 7.3 3.4 - 10.8 x10E3/uL   RBC 4.45 3.77 - 5.28 x10E6/uL   Hemoglobin 13.4 11.1 - 15.9 g/dL   Hematocrit 11.9 14.7 - 46.6 %   MCV 94 79 - 97 fL   MCH 30.1 26.6 - 33.0 pg   MCHC 31.9 31.5 - 35.7 g/dL   RDW 82.9 56.2 - 13.0 %   Platelets 264 150 - 450 x10E3/uL   Neutrophils 66 Not Estab. %   Lymphs 21 Not Estab. %   Monocytes 9 Not Estab. %   Eos 3 Not Estab. %   Basos 1 Not Estab. %    Neutrophils Absolute 4.8 1.4 - 7.0 x10E3/uL   Lymphocytes Absolute 1.6 0.7 - 3.1 x10E3/uL   Monocytes Absolute 0.7 0.1 - 0.9 x10E3/uL   EOS (ABSOLUTE) 0.2 0.0 - 0.4 x10E3/uL   Basophils Absolute 0.1 0.0 - 0.2 x10E3/uL   Immature Granulocytes 0 Not Estab. %   Immature Grans (Abs) 0.0 0.0 - 0.1 x10E3/uL  TSH + free T4  Result Value Ref Range   TSH 0.575 0.450 - 4.500 uIU/mL   Free T4 1.52 0.82 - 1.77 ng/dL  Lipid panel  Result Value Ref Range   Cholesterol, Total 215 (H) 100 - 199 mg/dL   Triglycerides 93 0 - 149 mg/dL   HDL 88 >86 mg/dL   VLDL Cholesterol Cal 16 5 - 40 mg/dL   LDL Chol Calc (NIH) 578 (H) 0 - 99 mg/dL   Chol/HDL Ratio 2.4 0.0 - 4.4 ratio    Assessment & Plan    Routine Health Maintenance and Physical Exam  Exercise Activities and Dietary recommendations  Goals   None  Immunization History  Administered Date(s) Administered   MMR 07/28/1992   Td 10/14/2015   Tdap 07/03/2005    Health Maintenance  Topic Date Due   COVID-19 Vaccine (1) Never done   Zoster Vaccines- Shingrix (1 of 2) Never done   PAP SMEAR-Modifier  08/28/2021   INFLUENZA VACCINE  10/11/2021   MAMMOGRAM  10/12/2022   COLONOSCOPY (Pts 45-6yrs Insurance coverage will need to be confirmed)  08/22/2025   TETANUS/TDAP  10/13/2025   Hepatitis C Screening  Completed   HIV Screening  Completed   HPV VACCINES  Aged Out    Discussed health benefits of physical activity, and encouraged her to engage in regular exercise appropriate for her age and condition.  Problem List Items Addressed This Visit       Other   Annual physical exam - Primary    PAP due 25 Mammo due 8/23 Colon due 27 UTD on routine eye and dental care Things to do to keep yourself healthy  - Exercise at least 30-45 minutes a day, 3-4 days a week.  - Eat a low-fat diet with lots of fruits and vegetables, up to 7-9 servings per day.  - Seatbelts can save your life. Wear them always.  - Smoke detectors on every  level of your home, check batteries every year.  - Eye Doctor - have an eye exam every 1-2 years  - Safe sex - if you may be exposed to STDs, use a condom.  - Alcohol -  If you drink, do it moderately, less than 2 drinks per day.  - Health Care Power of Attorney. Choose someone to speak for you if you are not able.  - Depression is common in our stressful world.If you're feeling down or losing interest in things you normally enjoy, please come in for a visit.  - Violence - If anyone is threatening or hurting you, please call immediately.        Relevant Orders   Hemoglobin A1c (Completed)   Comprehensive Metabolic Panel (CMET) (Completed)   CBC with Differential/Platelet (Completed)   TSH + free T4 (Completed)   Lipid panel (Completed)   Bradycardia with 51-60 beats per minute    Benign, resolving CTM No associated symptoms       Elevated blood pressure reading in office without diagnosis of hypertension    Blood pressure elevated >140/>90 on initial check; came down to 126/79 Continue to monitor      Elevated LDL cholesterol level    Repeat LP LDL remains elevated >100 The 10-year ASCVD risk score (Arnett DK, et al., 2019) is: 3.6%   Values used to calculate the score:     Age: 53 years     Sex: Female     Is Non-Hispanic African American: No     Diabetic: No     Tobacco smoker: No     Systolic Blood Pressure: 126 mmHg     Is BP treated: No     HDL Cholesterol: 88 mg/dL     Total Cholesterol: 215 mg/dL Heart attack and stroke risk is 4% estimated within the next 10 years which is low.  I recommend diet low in saturated fat and regular exercise - 30 min at least 5 times per week         Relevant Orders   Comprehensive Metabolic Panel (CMET) (Completed)   Lipid panel (Completed)   Family history of diabetes mellitus in grandfather    Check a1c given family hx; htn, although  resolving and hld Continue to recommend balanced, lower carb meals. Smaller meal size,  adding snacks. Choosing water as drink of choice and increasing purposeful exercise.       Relevant Orders   Hemoglobin A1c (Completed)   Herpes simplex    Chronic, stable Associated with stress Reports >10 outbreaks/year Recommend daily antiviral to assist in best prevention      Relevant Medications   valACYclovir (VALTREX) 1000 MG tablet   Screening mammogram for breast cancer    Due for screening for mammogram, denies breast concerns, provided with phone number to call and schedule appointment for mammogram. Encouraged to repeat breast cancer screening every 1-2 years.       Relevant Orders   MM 3D SCREEN BREAST BILATERAL     Return in about 1 year (around 10/08/2022) for annual examination.     Leilani Merl, FNP, have reviewed all documentation for this visit. The documentation on 10/10/21 for the exam, diagnosis, procedures, and orders are all accurate and complete.    Sara Kindle, FNP  [facility] [phone] (phone) [phone] (fax)  [facility]

## 2021-10-08 LAB — TSH+FREE T4
Free T4: 1.52 ng/dL (ref 0.82–1.77)
TSH: 0.575 u[IU]/mL (ref 0.450–4.500)

## 2021-10-08 LAB — COMPREHENSIVE METABOLIC PANEL
ALT: 10 IU/L (ref 0–32)
AST: 11 IU/L (ref 0–40)
Albumin/Globulin Ratio: 1.9 (ref 1.2–2.2)
Albumin: 4.5 g/dL (ref 3.9–4.9)
Alkaline Phosphatase: 65 IU/L (ref 44–121)
BUN/Creatinine Ratio: 19 (ref 12–28)
BUN: 16 mg/dL (ref 8–27)
Bilirubin Total: 0.6 mg/dL (ref 0.0–1.2)
CO2: 21 mmol/L (ref 20–29)
Calcium: 10 mg/dL (ref 8.7–10.3)
Chloride: 105 mmol/L (ref 96–106)
Creatinine, Ser: 0.84 mg/dL (ref 0.57–1.00)
Globulin, Total: 2.4 g/dL (ref 1.5–4.5)
Glucose: 98 mg/dL (ref 70–99)
Potassium: 4.7 mmol/L (ref 3.5–5.2)
Sodium: 141 mmol/L (ref 134–144)
Total Protein: 6.9 g/dL (ref 6.0–8.5)
eGFR: 78 mL/min/{1.73_m2} (ref >59)

## 2021-10-08 LAB — LIPID PANEL
Chol/HDL Ratio: 2.4 ratio (ref 0.0–4.4)
Cholesterol, Total: 215 mg/dL — ABNORMAL HIGH (ref 100–199)
HDL: 88 mg/dL (ref >39)
LDL Chol Calc (NIH): 111 mg/dL — ABNORMAL HIGH (ref 0–99)
Triglycerides: 93 mg/dL (ref 0–149)
VLDL Cholesterol Cal: 16 mg/dL (ref 5–40)

## 2021-10-08 LAB — CBC WITH DIFFERENTIAL/PLATELET
Basophils Absolute: 0.1 10*3/uL (ref 0.0–0.2)
Basos: 1 %
EOS (ABSOLUTE): 0.2 10*3/uL (ref 0.0–0.4)
Eos: 3 %
Hematocrit: 42 % (ref 34.0–46.6)
Hemoglobin: 13.4 g/dL (ref 11.1–15.9)
Immature Grans (Abs): 0 10*3/uL (ref 0.0–0.1)
Immature Granulocytes: 0 %
Lymphocytes Absolute: 1.6 10*3/uL (ref 0.7–3.1)
Lymphs: 21 %
MCH: 30.1 pg (ref 26.6–33.0)
MCHC: 31.9 g/dL (ref 31.5–35.7)
MCV: 94 fL (ref 79–97)
Monocytes Absolute: 0.7 10*3/uL (ref 0.1–0.9)
Monocytes: 9 %
Neutrophils Absolute: 4.8 10*3/uL (ref 1.4–7.0)
Neutrophils: 66 %
Platelets: 264 10*3/uL (ref 150–450)
RBC: 4.45 x10E6/uL (ref 3.77–5.28)
RDW: 12.1 % (ref 11.7–15.4)
WBC: 7.3 10*3/uL (ref 3.4–10.8)

## 2021-10-08 LAB — HEMOGLOBIN A1C
Est. average glucose Bld gHb Est-mCnc: 108 mg/dL
Hgb A1c MFr Bld: 5.4 % (ref 4.8–5.6)

## 2021-10-10 NOTE — Assessment & Plan Note (Signed)
Check a1c given family hx; htn, although resolving and hld Continue to recommend balanced, lower carb meals. Smaller meal size, adding snacks. Choosing water as drink of choice and increasing purposeful exercise.

## 2021-10-10 NOTE — Assessment & Plan Note (Signed)
PAP due 25 Mammo due 8/23 Colon due 27 UTD on routine eye and dental care Things to do to keep yourself healthy  - Exercise at least 30-45 minutes a day, 3-4 days a week.  - Eat a low-fat diet with lots of fruits and vegetables, up to 7-9 servings per day.  - Seatbelts can save your life. Wear them always.  - Smoke detectors on every level of your home, check batteries every year.  - Eye Doctor - have an eye exam every 1-2 years  - Safe sex - if you may be exposed to STDs, use a condom.  - Alcohol -  If you drink, do it moderately, less than 2 drinks per day.  - Friendly. Choose someone to speak for you if you are not able.  - Depression is common in our stressful world.If you're feeling down or losing interest in things you normally enjoy, please come in for a visit.  - Violence - If anyone is threatening or hurting you, please call immediately.

## 2021-10-10 NOTE — Assessment & Plan Note (Signed)
Benign, resolving CTM No associated symptoms

## 2021-10-10 NOTE — Assessment & Plan Note (Signed)
Blood pressure elevated >140/>90 on initial check; came down to 126/79 Continue to monitor

## 2021-10-10 NOTE — Assessment & Plan Note (Signed)
Due for screening for mammogram, denies breast concerns, provided with phone number to call and schedule appointment for mammogram. Encouraged to repeat breast cancer screening every 1-2 years.  

## 2021-10-10 NOTE — Assessment & Plan Note (Signed)
Chronic, stable Associated with stress Reports >10 outbreaks/year Recommend daily antiviral to assist in best prevention

## 2021-10-10 NOTE — Progress Notes (Signed)
Cholesterol panel shows -elevated bad/LDL cholesterol >100 -elevated total cholesterol, >200  The 10-year ASCVD risk score (Arnett DK, et al., 2019) is: 3.6%   Values used to calculate the score:     Age: 61 years     Sex: Female     Is Non-Hispanic African American: No     Diabetic: No     Tobacco smoker: No     Systolic Blood Pressure: 364 mmHg     Is BP treated: No     HDL Cholesterol: 88 mg/dL     Total Cholesterol: 215 mg/dL Heart attack and stroke risk is 4% estimated within the next 10 years which is low.  All other labs are normal and stable.  Please let us know if you have any questions.  Thank you, Gwyneth Sprout, [address] [place], [place] [address] [phone] (phone) [phone] (fax) [place]

## 2021-10-10 NOTE — Assessment & Plan Note (Addendum)
Repeat LP LDL remains elevated >100 The 10-year ASCVD risk score (Arnett DK, et al., 2019) is: 3.6%   Values used to calculate the score:     Age: 61 years     Sex: Female     Is Non-Hispanic African American: No     Diabetic: No     Tobacco smoker: No     Systolic Blood Pressure: 330 mmHg     Is BP treated: No     HDL Cholesterol: 88 mg/dL     Total Cholesterol: 215 mg/dL Heart attack and stroke risk is 4% estimated within the next 10 years which is low.  I recommend diet low in saturated fat and regular exercise - 30 min at least 5 times per week

## 2021-11-18 DIAGNOSIS — H524 Presbyopia: Secondary | ICD-10-CM | POA: Diagnosis not present

## 2021-11-18 DIAGNOSIS — H52221 Regular astigmatism, right eye: Secondary | ICD-10-CM | POA: Diagnosis not present

## 2021-11-18 DIAGNOSIS — H5201 Hypermetropia, right eye: Secondary | ICD-10-CM | POA: Diagnosis not present

## 2021-11-18 DIAGNOSIS — H40013 Open angle with borderline findings, low risk, bilateral: Secondary | ICD-10-CM | POA: Diagnosis not present

## 2021-11-18 DIAGNOSIS — H5212 Myopia, left eye: Secondary | ICD-10-CM | POA: Diagnosis not present

## 2021-12-09 ENCOUNTER — Ambulatory Visit
Admission: RE | Admit: 2021-12-09 | Discharge: 2021-12-09 | Disposition: A | Discharge status: Home / Self Care | Payer: 59 | Source: Ambulatory Visit | Attending: Family Medicine | Admitting: Family Medicine

## 2021-12-09 DIAGNOSIS — Z1231 Encounter for screening mammogram for malignant neoplasm of breast: Secondary | ICD-10-CM | POA: Diagnosis not present

## 2021-12-12 NOTE — Progress Notes (Signed)
Hi Sara Figueroa  Normal mammogram; repeat in 1 year.  Please let us know if you have any questions.  Thank you,  Tally Joe, FNP

## 2022-01-04 ENCOUNTER — Other Ambulatory Visit: Payer: Self-pay

## 2022-03-17 ENCOUNTER — Other Ambulatory Visit: Payer: Self-pay

## 2022-03-20 ENCOUNTER — Other Ambulatory Visit: Payer: Self-pay

## 2022-03-24 ENCOUNTER — Other Ambulatory Visit: Payer: Self-pay

## 2022-05-05 ENCOUNTER — Other Ambulatory Visit (HOSPITAL_COMMUNITY): Payer: Self-pay

## 2022-07-03 ENCOUNTER — Other Ambulatory Visit (HOSPITAL_COMMUNITY): Payer: Self-pay

## 2022-07-04 ENCOUNTER — Other Ambulatory Visit: Payer: Self-pay

## 2022-07-04 ENCOUNTER — Other Ambulatory Visit (HOSPITAL_COMMUNITY): Payer: Self-pay

## 2022-07-04 ENCOUNTER — Encounter (HOSPITAL_COMMUNITY): Payer: Self-pay

## 2022-07-07 ENCOUNTER — Other Ambulatory Visit (HOSPITAL_COMMUNITY): Payer: Self-pay

## 2022-10-13 ENCOUNTER — Encounter: Payer: Self-pay | Admitting: Family Medicine

## 2022-10-13 ENCOUNTER — Other Ambulatory Visit: Payer: Self-pay

## 2022-10-13 ENCOUNTER — Ambulatory Visit (INDEPENDENT_AMBULATORY_CARE_PROVIDER_SITE_OTHER): Payer: 59 | Admitting: Family Medicine

## 2022-10-13 VITALS — BP 139/77 | HR 62 | Ht 64.5 in | Wt 173.1 lb

## 2022-10-13 DIAGNOSIS — I10 Essential (primary) hypertension: Secondary | ICD-10-CM | POA: Diagnosis not present

## 2022-10-13 DIAGNOSIS — E7800 Pure hypercholesterolemia, unspecified: Secondary | ICD-10-CM

## 2022-10-13 DIAGNOSIS — Z833 Family history of diabetes mellitus: Secondary | ICD-10-CM

## 2022-10-13 DIAGNOSIS — Z0000 Encounter for general adult medical examination without abnormal findings: Secondary | ICD-10-CM | POA: Diagnosis not present

## 2022-10-13 DIAGNOSIS — Z1231 Encounter for screening mammogram for malignant neoplasm of breast: Secondary | ICD-10-CM

## 2022-10-13 DIAGNOSIS — B009 Herpesviral infection, unspecified: Secondary | ICD-10-CM | POA: Diagnosis not present

## 2022-10-13 MED ORDER — VALACYCLOVIR HCL 1 G PO TABS
1000.0000 mg | ORAL_TABLET | Freq: Every day | ORAL | 3 refills | Status: AC
  Filled 2022-10-13: qty 90, 90d supply, fill #0
  Filled 2023-01-10: qty 90, 90d supply, fill #1
  Filled 2023-04-06: qty 90, 90d supply, fill #2
  Filled 2023-07-09: qty 90, 90d supply, fill #3

## 2022-10-13 NOTE — Assessment & Plan Note (Signed)
Chronic, previously stable Repeat LP recommend diet low in saturated fat and regular exercise - 30 min at least 5 times per week The 10-year ASCVD risk score (Arnett DK, et al., 2019) is: 4.9%

## 2022-10-13 NOTE — Assessment & Plan Note (Signed)
Due for screening for mammogram, denies breast concerns, provided with phone number to call and schedule appointment for mammogram. Encouraged to repeat breast cancer screening every 1-2 years.  Due 12/11/22 or later

## 2022-10-13 NOTE — Assessment & Plan Note (Signed)
Chronic, stable Continue daily antiviral to assist No current complaints

## 2022-10-13 NOTE — Assessment & Plan Note (Signed)
Hx of borderline A1c; 5.4-5.6 Repeat A1c Continue to recommend balanced, lower carb meals. Smaller meal size, adding snacks. Choosing water as drink of choice and increasing purposeful exercise. Controlled with diet/exercise

## 2022-10-13 NOTE — Assessment & Plan Note (Signed)
No concerns; upcoming retirement  Things to do to keep yourself healthy  - Exercise at least 30-45 minutes a day, 3-4 days a week.  - Eat a low-fat diet with lots of fruits and vegetables, up to 7-9 servings per day.  - Seatbelts can save your life. Wear them always.  - Smoke detectors on every level of your home, check batteries every year.  - Eye Doctor - have an eye exam every 1-2 years  - Safe sex - if you may be exposed to STDs, use a condom.  - Alcohol -  If you drink, do it moderately, less than 2 drinks per day.  - Health Care Power of Attorney. Choose someone to speak for you if you are not able.  - Depression is common in our stressful world.If you're feeling down or losing interest in things you normally enjoy, please come in for a visit.  - Violence - If anyone is threatening or hurting you, please call immediately.

## 2022-10-13 NOTE — Assessment & Plan Note (Signed)
Chronic, borderline recommend diet low in saturated fat and regular exercise - 30 min at least 5 times per week Goal <140/<89 Currently controlled with diet/exercise

## 2022-10-13 NOTE — Progress Notes (Signed)
Complete physical exam   Patient: Sara Figueroa   DOB: 1960-06-24   62 y.o. Female  MRN: [id number] Visit Date: 10/13/2022  Today's healthcare provider: [name], FNP  Introduced to nurse practitioner role and practice setting.  All questions answered.  Discussed provider/patient relationship and expectations.  Chief Complaint  Patient presents with   Annual Exam    Last exam 10/07/21   Subjective    Sara Figueroa is a 62 y.o. female who presents today for a complete physical exam.  She reports consuming a general diet. The patient does not participate in regular exercise at present. She generally feels well. She reports sleeping well. She does have additional problems to discuss today.  HPI HPI     Annual Exam    Additional comments: Last exam 10/07/21      Last edited by Acey Lav, CMA on 10/13/2022  9:53 AM.      Past Medical History:  Diagnosis Date   Allergy    seasonal   Anemia    Arthritis    Complication of anesthesia    Anesthesia please look at MRI  Lumbar spine-results from 08/05/2013   Heart murmur of newborn    Past Surgical History:  Procedure Laterality Date   COLONOSCOPY     cyst on eyebrow     right   REFRACTIVE SURGERY  2008   Bil   TOTAL HIP ARTHROPLASTY Right 07/03/2017   Procedure: RIGHT TOTAL HIP ARTHROPLASTY ANTERIOR APPROACH;  Surgeon: Durene Romans, MD;  Location: [facility];  Service: Orthopedics;  Laterality: Right;  70 mins   Social History   Socioeconomic History   Marital status: Married    Spouse name: Not on file   Number of children: Not on file   Years of education: Not on file   Highest education level: Not on file  Occupational History   Not on file  Tobacco Use   Smoking status: Former    Current packs/day: 0.15    Average packs/day: 0.2 packs/day for 38.6 years (5.8 ttl pk-yrs)    Types: Cigarettes    Start date: 40   Smokeless tobacco: Never  Vaping Use   Vaping status: Never Used  Substance and  Sexual Activity   Alcohol use: Yes    Alcohol/week: 3.0 - 4.0 standard drinks of alcohol    Types: 3 - 4 Glasses of wine per week    Comment: DRINKS 1 GLASS OF WINE EVERY OTHER NIGHT   Drug use: No   Sexual activity: Not on file  Other Topics Concern   Not on file  Social History Narrative   Not on file   Social Determinants of Health   Financial Resource Strain: Not on file  Food Insecurity: Not on file  Transportation Needs: Not on file  Physical Activity: Not on file  Stress: Not on file  Social Connections: Not on file  Intimate Partner Violence: Not on file   Family Status  Relation Name Status   Mother  Alive   Father  Deceased at age 34   Sister  Alive   Brother  Alive   MGM  Deceased   MGF  Deceased   PGM  Deceased   PGF  Deceased   Niece Maternal Alive   Neg Hx  (Not Specified)  No partnership data on file   Family History  Problem Relation Age of Onset   Heart disease Mother    Hypertension Mother  Ulcerative colitis Mother    Atrial fibrillation Mother    Alzheimer's disease Mother    Emphysema Father    Parkinson's disease Father    Skin cancer Father    Stroke Father    Fibroids Sister    Hypertension Brother    Cancer Maternal Grandmother    Cancer Maternal Grandfather    Congestive Heart Failure Paternal Grandmother    Heart failure Paternal Grandmother    Diabetes Paternal Grandfather    Colon cancer Niece    Breast cancer Neg Hx    No Known Allergies  Patient Care Team: [name], FNP as PCP - General (Family Medicine)   Medications: Outpatient Medications Prior to Visit  Medication Sig   Calcium Carb-Cholecalciferol 600-800 MG-UNIT TABS Take 1 tablet by mouth. Take one pill 3 times a week   loratadine (CLARITIN) 10 MG tablet Take 10 mg by mouth daily as needed.    omeprazole (PRILOSEC) 20 MG capsule Take 20 mg by mouth daily as needed.    [DISCONTINUED] valACYclovir (VALTREX) 1000 MG tablet Take 1 tablet (1,000 mg total) by  mouth daily.   No facility-administered medications prior to visit.    Objective    BP 139/77 (BP Location: Right Arm, Patient Position: Sitting, Cuff Size: Large)   Pulse 62   Ht 5' 4.5" (1.638 m)   Wt 173 lb 1.6 oz (78.5 kg)   SpO2 100%   BMI 29.25 kg/m   Physical Exam Vitals and nursing note reviewed.  Constitutional:      General: She is awake. She is not in acute distress.    Appearance: Normal appearance. She is well-developed, well-groomed and overweight. She is not ill-appearing, toxic-appearing or diaphoretic.  HENT:     Head: Normocephalic and atraumatic.     Jaw: There is normal jaw occlusion. No trismus, tenderness, swelling or pain on movement.     Right Ear: Hearing, tympanic membrane, ear canal and external ear normal. There is no impacted cerumen.     Left Ear: Hearing, tympanic membrane, ear canal and external ear normal. There is no impacted cerumen.     Nose: Nose normal. No congestion or rhinorrhea.     Right Turbinates: Not enlarged, swollen or pale.     Left Turbinates: Not enlarged, swollen or pale.     Right Sinus: No maxillary sinus tenderness or frontal sinus tenderness.     Left Sinus: No maxillary sinus tenderness or frontal sinus tenderness.     Mouth/Throat:     Lips: Pink.     Mouth: Mucous membranes are moist. No injury.     Tongue: No lesions.     Pharynx: Oropharynx is clear. Uvula midline. No pharyngeal swelling, oropharyngeal exudate, posterior oropharyngeal erythema or uvula swelling.     Tonsils: No tonsillar exudate or tonsillar abscesses.  Eyes:     General: Lids are normal. Lids are everted, no foreign bodies appreciated. Vision grossly intact. Gaze aligned appropriately. No allergic shiner or visual field deficit.       Right eye: No discharge.        Left eye: No discharge.     Extraocular Movements: Extraocular movements intact.     Conjunctiva/sclera: Conjunctivae normal.     Right eye: Right conjunctiva is not injected. No  exudate.    Left eye: Left conjunctiva is not injected. No exudate.    Pupils: Pupils are equal, round, and reactive to light.  Neck:     Thyroid: No thyroid mass, thyromegaly  or thyroid tenderness.     Vascular: No carotid bruit.     Trachea: Trachea normal.  Cardiovascular:     Rate and Rhythm: Normal rate and regular rhythm.     Pulses: Normal pulses.          Carotid pulses are 2+ on the right side and 2+ on the left side.      Radial pulses are 2+ on the right side and 2+ on the left side.       Dorsalis pedis pulses are 2+ on the right side and 2+ on the left side.       Posterior tibial pulses are 2+ on the right side and 2+ on the left side.     Heart sounds: Normal heart sounds, S1 normal and S2 normal. No murmur heard.    No friction rub. No gallop.  Pulmonary:     Effort: Pulmonary effort is normal. No respiratory distress.     Breath sounds: Normal breath sounds and air entry. No stridor. No wheezing, rhonchi or rales.  Chest:     Chest wall: No tenderness.  Abdominal:     General: Abdomen is flat. Bowel sounds are normal. There is no distension.     Palpations: Abdomen is soft. There is no mass.     Tenderness: There is no abdominal tenderness. There is no right CVA tenderness, left CVA tenderness, guarding or rebound.     Hernia: No hernia is present.  Genitourinary:    Comments: Exam deferred; denies complaints Musculoskeletal:        General: No swelling, tenderness, deformity or signs of injury. Normal range of motion.     Cervical back: Full passive range of motion without pain, normal range of motion and neck supple. No edema, rigidity or tenderness. No muscular tenderness.     Right lower leg: No edema.     Left lower leg: No edema.  Lymphadenopathy:     Cervical: No cervical adenopathy.     Right cervical: No superficial, deep or posterior cervical adenopathy.    Left cervical: No superficial, deep or posterior cervical adenopathy.  Skin:    General: Skin is  warm and dry.     Capillary Refill: Capillary refill takes less than 2 seconds.     Coloration: Skin is not jaundiced or pale.     Findings: No bruising, erythema, lesion or rash.  Neurological:     General: No focal deficit present.     Mental Status: She is alert and oriented to person, place, and time. Mental status is at baseline.     GCS: GCS eye subscore is 4. GCS verbal subscore is 5. GCS motor subscore is 6.     Sensory: Sensation is intact. No sensory deficit.     Motor: Motor function is intact. No weakness.     Coordination: Coordination is intact. Coordination normal.     Gait: Gait is intact. Gait normal.  Psychiatric:        Attention and Perception: Attention and perception normal.        Mood and Affect: Mood and affect normal.        Speech: Speech normal.        Behavior: Behavior normal. Behavior is cooperative.        Thought Content: Thought content normal.        Cognition and Memory: Cognition and memory normal.        Judgment: Judgment normal.     Last depression  screening scores    10/13/2022   10:06 AM 10/07/2021    8:52 AM 10/07/2021    8:34 AM  PHQ 2/9 Scores  PHQ - 2 Score 0 0 0  PHQ- 9 Score  0 0   Last fall risk screening    10/13/2022   10:06 AM  Fall Risk   Falls in the past year? 0  Injury with Fall? 0  Risk for fall due to : No Fall Risks  Follow up Falls evaluation completed   Last Audit-C alcohol use screening    07/30/2020    1:44 PM  Alcohol Use Disorder Test (AUDIT)  1. How often do you have a drink containing alcohol? 3  2. How many drinks containing alcohol do you have on a typical day when you are drinking? 0  3. How often do you have six or more drinks on one occasion? 1  AUDIT-C Score 4   A score of 3 or more in women, and 4 or more in men indicates increased risk for alcohol abuse, EXCEPT if all of the points are from question 1   No results found for any visits on 10/13/22.  Assessment & Plan    Routine Health  Maintenance and Physical Exam  Exercise Activities and Dietary recommendations  Goals   None     Immunization History  Administered Date(s) Administered   MMR 07/28/1992   Td 10/14/2015   Tdap 07/03/2005    Health Maintenance  Topic Date Due   Zoster Vaccines- Shingrix (1 of 2) Never done   COVID-19 Vaccine (1 - 2023-24 season) Never done   INFLUENZA VACCINE  06/11/2023 (Originally 10/12/2022)   PAP SMEAR-Modifier  08/29/2023   MAMMOGRAM  12/10/2023   Colonoscopy  08/22/2025   DTaP/Tdap/Td (3 - Td or Tdap) 10/13/2025   Hepatitis C Screening  Completed   HIV Screening  Completed   HPV VACCINES  Aged Out    Discussed health benefits of physical activity, and encouraged her to engage in regular exercise appropriate for her age and condition.  Problem List Items Addressed This Visit       Cardiovascular and Mediastinum   Primary hypertension    Chronic, borderline recommend diet low in saturated fat and regular exercise - 30 min at least 5 times per week Goal <140/<89 Currently controlled with diet/exercise      Relevant Orders   Comprehensive metabolic panel   CBC with Differential/Platelet     Other   Annual physical exam - Primary    No concerns; upcoming retirement  Things to do to keep yourself healthy  - Exercise at least 30-45 minutes a day, 3-4 days a week.  - Eat a low-fat diet with lots of fruits and vegetables, up to 7-9 servings per day.  - Seatbelts can save your life. Wear them always.  - Smoke detectors on every level of your home, check batteries every year.  - Eye Doctor - have an eye exam every 1-2 years  - Safe sex - if you may be exposed to STDs, use a condom.  - Alcohol -  If you drink, do it moderately, less than 2 drinks per day.  - Health Care Power of Attorney. Choose someone to speak for you if you are not able.  - Depression is common in our stressful world.If you're feeling down or losing interest in things you normally enjoy, please  come in for a visit.  - Violence - If anyone is threatening or hurting  you, please call immediately.       Relevant Orders   Lipid panel   Comprehensive metabolic panel   CBC with Differential/Platelet   Hemoglobin A1c   TSH   Elevated LDL cholesterol level    Chronic, previously stable Repeat LP recommend diet low in saturated fat and regular exercise - 30 min at least 5 times per week The 10-year ASCVD risk score (Arnett DK, et al., 2019) is: 4.9%       Relevant Orders   Lipid panel   Family history of diabetes mellitus in grandfather    Hx of borderline A1c; 5.4-5.6 Repeat A1c Continue to recommend balanced, lower carb meals. Smaller meal size, adding snacks. Choosing water as drink of choice and increasing purposeful exercise. Controlled with diet/exercise      Relevant Orders   Hemoglobin A1c   Herpes simplex    Chronic, stable Continue daily antiviral to assist No current complaints      Relevant Medications   valACYclovir (VALTREX) 1000 MG tablet   Screening mammogram for breast cancer    Due for screening for mammogram, denies breast concerns, provided with phone number to call and schedule appointment for mammogram. Encouraged to repeat breast cancer screening every 1-2 years.  Due 12/11/22 or later      Relevant Orders   MM 3D SCREENING MAMMOGRAM BILATERAL BREAST   Return in about 1 year (around 10/13/2023) for annual examination.    Leilani Merl, FNP, have reviewed all documentation for this visit. The documentation on 10/13/22 for the exam, diagnosis, procedures, and orders are all accurate and complete.  [name], FNP  [facility] [phone] (phone) [phone] (fax)  [facility]

## 2022-12-29 ENCOUNTER — Ambulatory Visit
Admission: RE | Admit: 2022-12-29 | Discharge: 2022-12-29 | Disposition: A | Discharge status: Home / Self Care | Payer: 59 | Source: Ambulatory Visit | Attending: Family Medicine | Admitting: Family Medicine

## 2022-12-29 DIAGNOSIS — Z1231 Encounter for screening mammogram for malignant neoplasm of breast: Secondary | ICD-10-CM | POA: Insufficient documentation

## 2023-01-10 ENCOUNTER — Other Ambulatory Visit: Payer: Self-pay

## 2023-01-10 ENCOUNTER — Other Ambulatory Visit (HOSPITAL_COMMUNITY): Payer: Self-pay

## 2023-03-12 ENCOUNTER — Other Ambulatory Visit: Payer: Self-pay

## 2023-03-13 ENCOUNTER — Other Ambulatory Visit: Payer: Self-pay

## 2023-03-13 MED ORDER — AMOXICILLIN 500 MG PO CAPS
2000.0000 mg | ORAL_CAPSULE | ORAL | 0 refills | Status: AC
  Filled 2023-03-13: qty 16, 4d supply, fill #0

## 2023-04-06 ENCOUNTER — Other Ambulatory Visit: Payer: Self-pay

## 2023-04-06 ENCOUNTER — Other Ambulatory Visit (HOSPITAL_COMMUNITY): Payer: Self-pay

## 2023-05-11 DIAGNOSIS — H5212 Myopia, left eye: Secondary | ICD-10-CM | POA: Diagnosis not present

## 2023-07-09 ENCOUNTER — Other Ambulatory Visit: Payer: Self-pay

## 2023-07-09 ENCOUNTER — Other Ambulatory Visit (HOSPITAL_COMMUNITY): Payer: Self-pay

## 2023-09-25 ENCOUNTER — Other Ambulatory Visit (HOSPITAL_COMMUNITY): Payer: Self-pay
